# Patient Record
Sex: Female | Born: 1975 | Race: Black or African American | Hispanic: No | Marital: Married | Smoking: Never smoker
Health system: Southern US, Community
[De-identification: ages and names within clinical notes are randomized; demographics above are authoritative.]

## PROBLEM LIST (undated history)

## (undated) DIAGNOSIS — E119 Type 2 diabetes mellitus without complications: Secondary | ICD-10-CM

## (undated) DIAGNOSIS — E785 Hyperlipidemia, unspecified: Secondary | ICD-10-CM

## (undated) DIAGNOSIS — C19 Malignant neoplasm of rectosigmoid junction: Secondary | ICD-10-CM

## (undated) HISTORY — PX: COLON SURGERY: SHX602

---

## 2006-10-25 ENCOUNTER — Encounter: Payer: Self-pay | Admitting: Physician Assistant

## 2006-10-25 ENCOUNTER — Ambulatory Visit: Payer: Self-pay | Admitting: Obstetrics & Gynecology

## 2006-10-26 ENCOUNTER — Ambulatory Visit: Payer: Self-pay | Admitting: Family Medicine

## 2006-10-27 ENCOUNTER — Ambulatory Visit: Payer: Self-pay | Admitting: Obstetrics and Gynecology

## 2006-10-31 ENCOUNTER — Ambulatory Visit (HOSPITAL_COMMUNITY): Admission: RE | Admit: 2006-10-31 | Discharge: 2006-10-31 | Payer: Self-pay | Admitting: Obstetrics & Gynecology

## 2006-11-08 ENCOUNTER — Ambulatory Visit: Payer: Self-pay | Admitting: Obstetrics & Gynecology

## 2006-11-13 ENCOUNTER — Ambulatory Visit: Payer: Self-pay | Admitting: Family Medicine

## 2006-11-13 ENCOUNTER — Encounter: Admission: RE | Admit: 2006-11-13 | Discharge: 2006-11-13 | Payer: Self-pay | Admitting: Obstetrics & Gynecology

## 2006-11-20 ENCOUNTER — Ambulatory Visit: Payer: Self-pay | Admitting: Obstetrics & Gynecology

## 2006-11-20 ENCOUNTER — Ambulatory Visit (HOSPITAL_COMMUNITY): Admission: RE | Admit: 2006-11-20 | Discharge: 2006-11-20 | Payer: Self-pay | Admitting: Obstetrics & Gynecology

## 2006-11-21 ENCOUNTER — Ambulatory Visit: Payer: Self-pay | Admitting: *Deleted

## 2006-11-21 ENCOUNTER — Inpatient Hospital Stay (HOSPITAL_COMMUNITY): Admission: AD | Admit: 2006-11-21 | Discharge: 2006-11-21 | Payer: Self-pay | Admitting: Family Medicine

## 2006-11-27 ENCOUNTER — Ambulatory Visit: Payer: Self-pay | Admitting: Obstetrics & Gynecology

## 2006-11-30 ENCOUNTER — Ambulatory Visit: Payer: Self-pay | Admitting: Obstetrics & Gynecology

## 2006-12-07 ENCOUNTER — Ambulatory Visit: Payer: Self-pay | Admitting: Obstetrics and Gynecology

## 2006-12-10 ENCOUNTER — Inpatient Hospital Stay (HOSPITAL_COMMUNITY): Admission: AD | Admit: 2006-12-10 | Discharge: 2006-12-14 | Payer: Self-pay | Admitting: Obstetrics & Gynecology

## 2006-12-10 ENCOUNTER — Ambulatory Visit: Payer: Self-pay | Admitting: Obstetrics & Gynecology

## 2006-12-11 ENCOUNTER — Encounter: Payer: Self-pay | Admitting: Obstetrics & Gynecology

## 2008-05-06 IMAGING — US US OB COMP +14 WK
1 series · 14 of 28 positions shown · non-contrast
Comparison: none

OBSTETRICAL ULTRASOUND:
 This ultrasound was performed in The [HOSPITAL], and the [facility] report will be stored to [REDACTED] PACS.

[Series 1: us ob comp +14 wk · 14 of 61 slices shown]
[im 3/61]
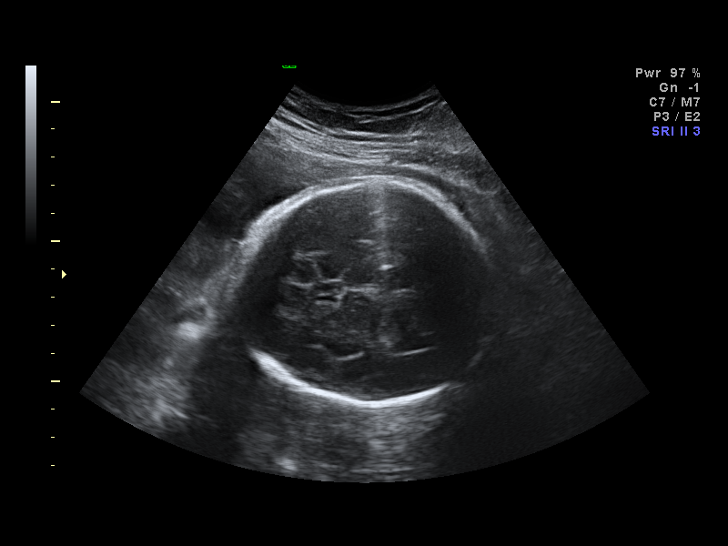
[im 7/61]
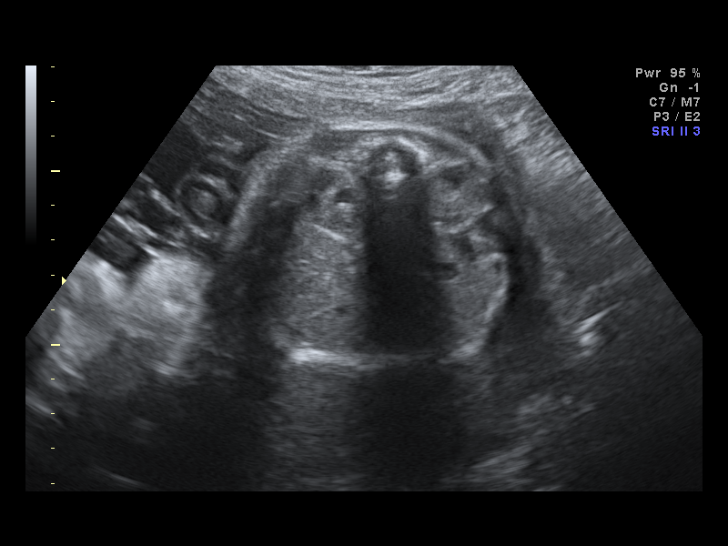
[im 12/61]
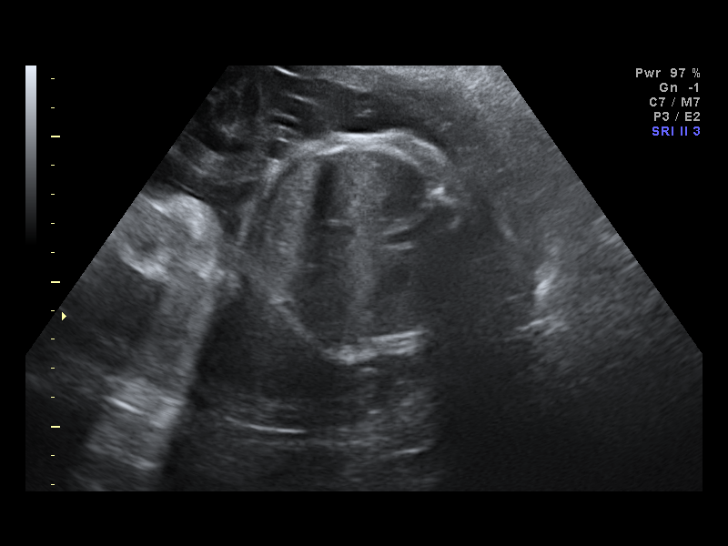
[im 16/61]
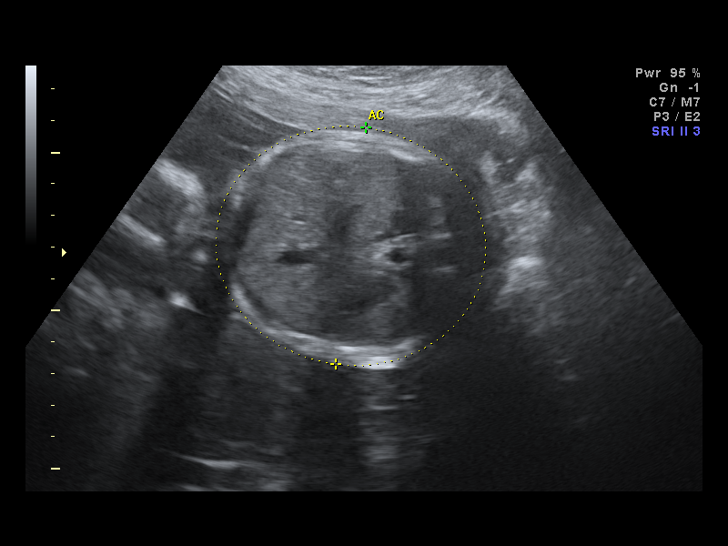
[im 21/61]
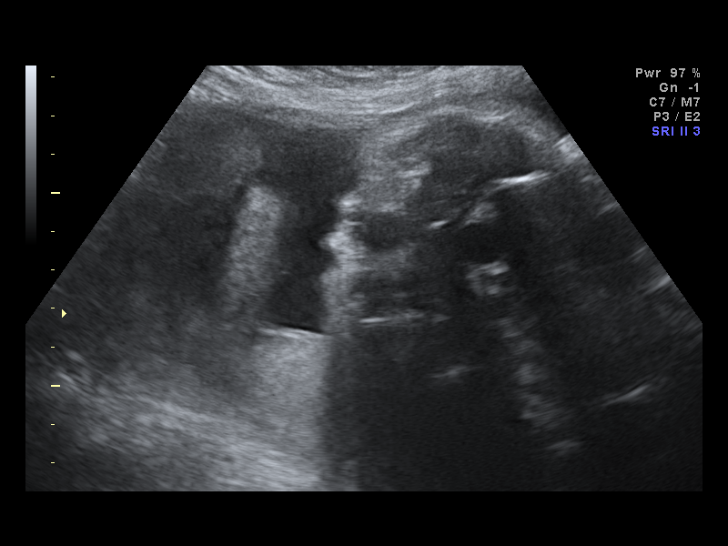
[im 25/61]
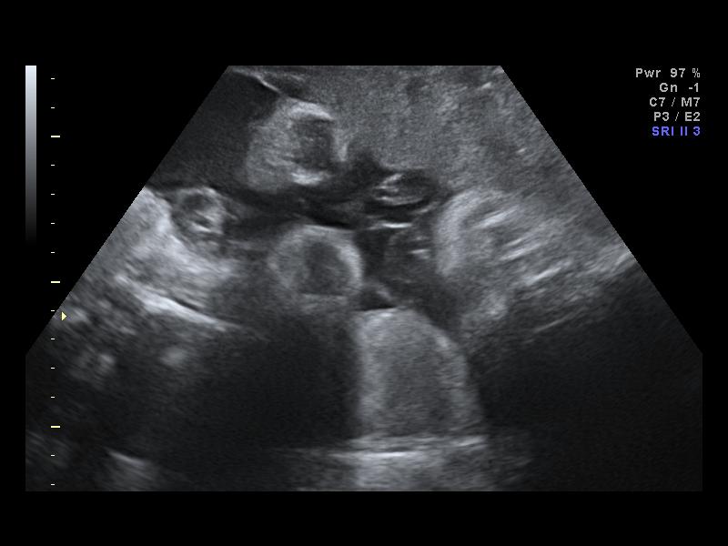
[im 29/61]
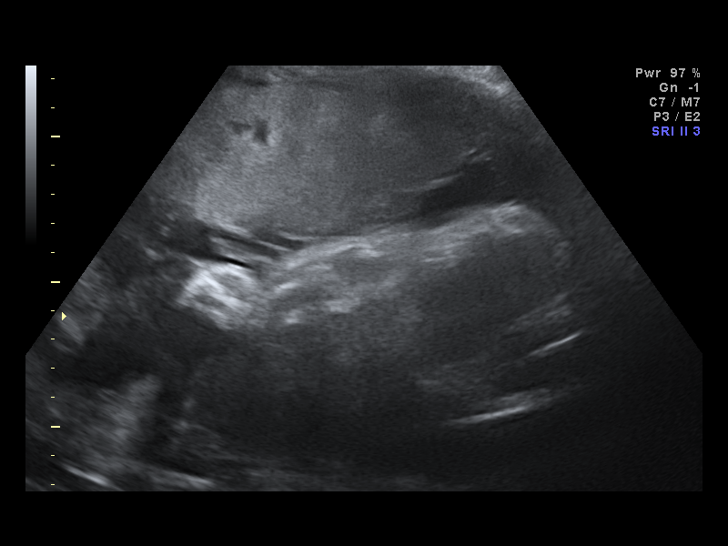
[im 34/61]
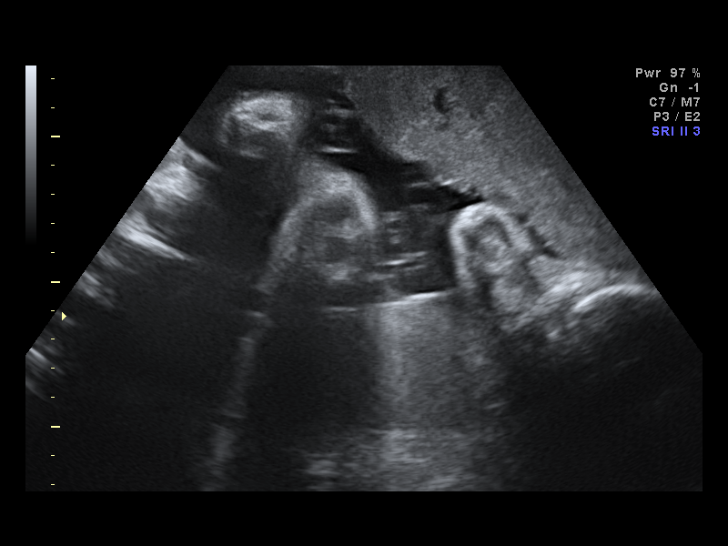
[im 38/61]
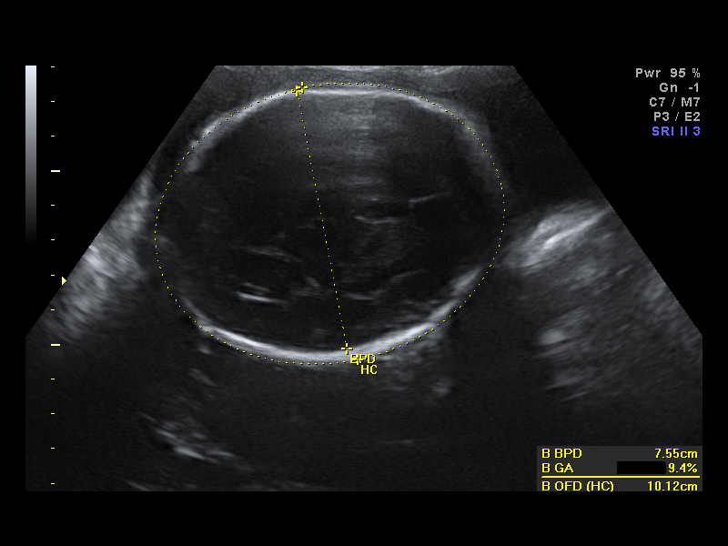
[im 43/61]
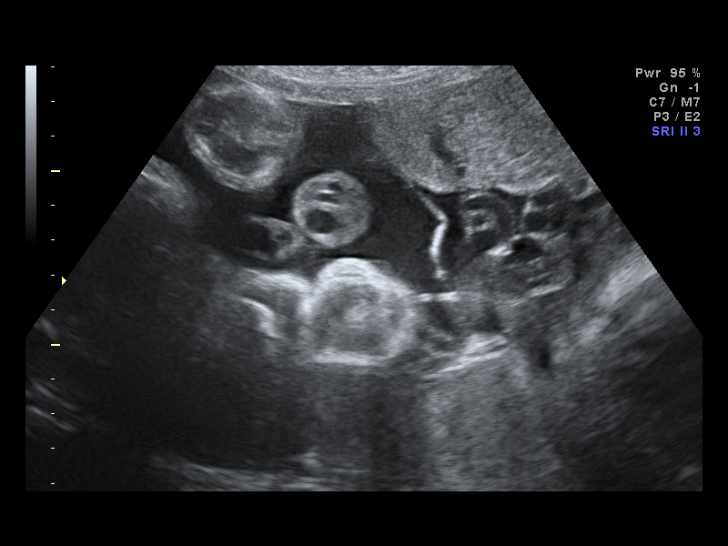
[im 47/61]
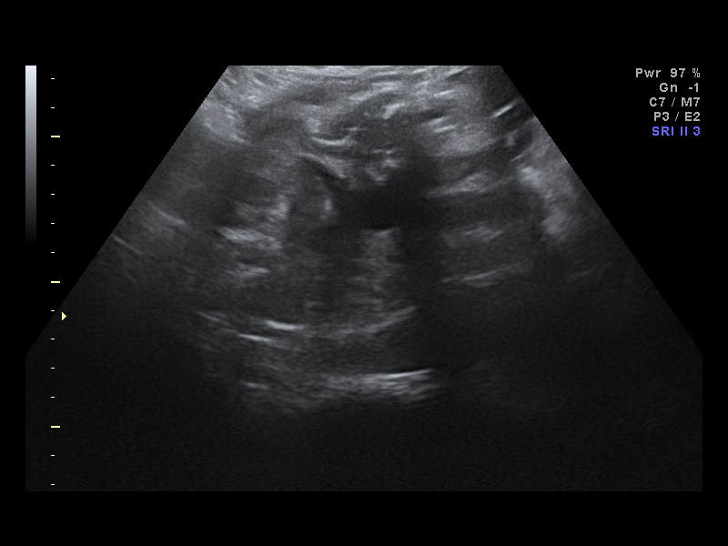
[im 52/61]
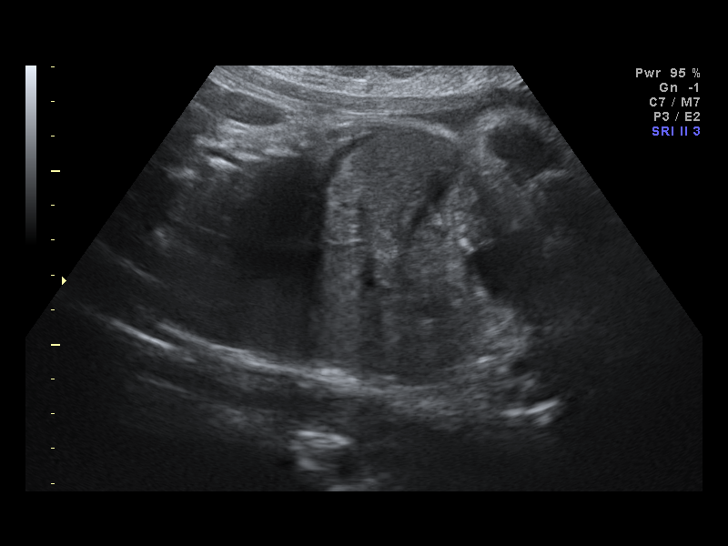
[im 56/61]
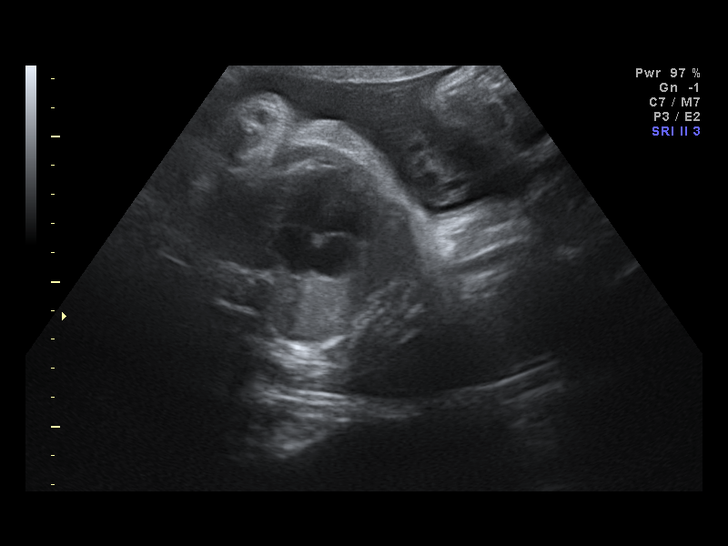
[im 61/61]
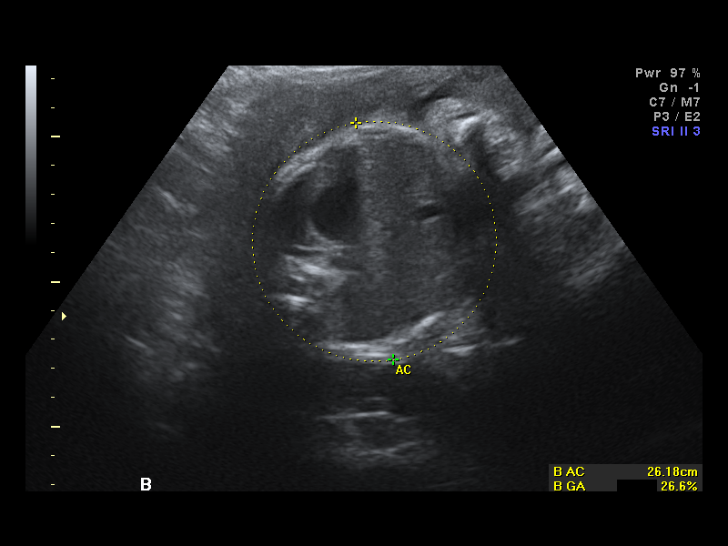

[14 of 28 positions shown; findings below may reference images not displayed]

IMPRESSION: The [facility] report has also been faxed to the ordering physician.

## 2008-05-26 IMAGING — US US OB FOLLOW-UP
1 series · 14 of 28 positions shown · non-contrast
Comparison: none

OBSTETRICAL ULTRASOUND:
 This ultrasound was performed in The [HOSPITAL], and the [facility] report will be stored to [REDACTED] PACS.

[Series 1: us ob follow-up · 14 of 51 slices shown]
[im 2/51]
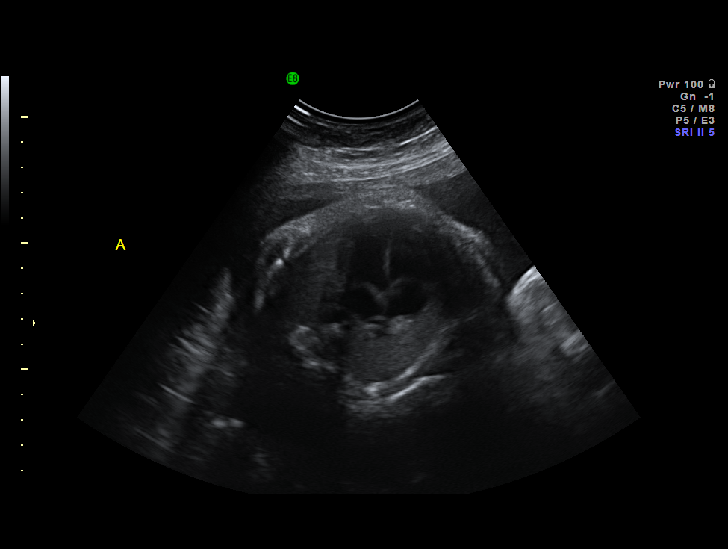
[im 6/51]
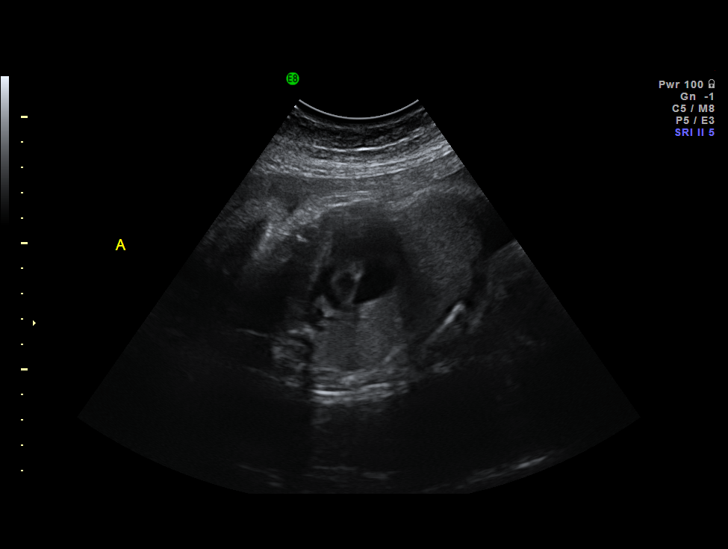
[im 10/51]
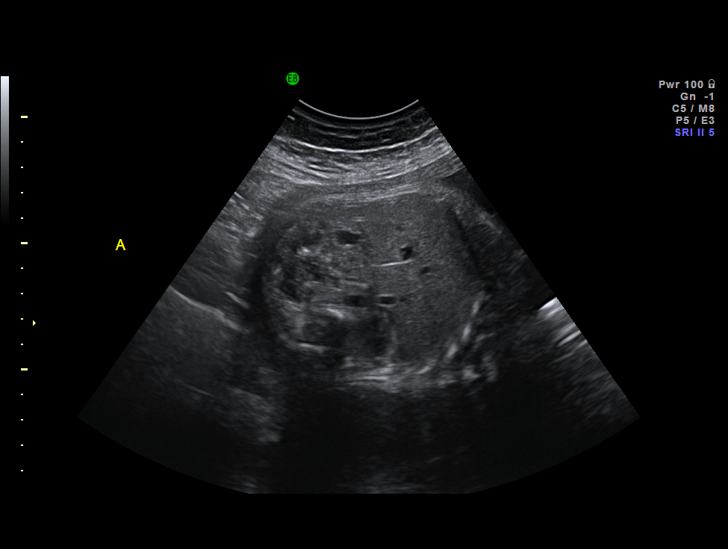
[im 13/51]
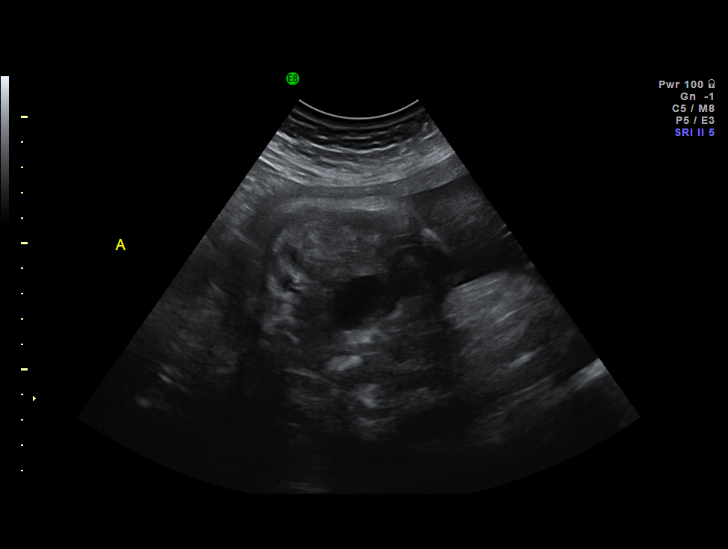
[im 17/51]
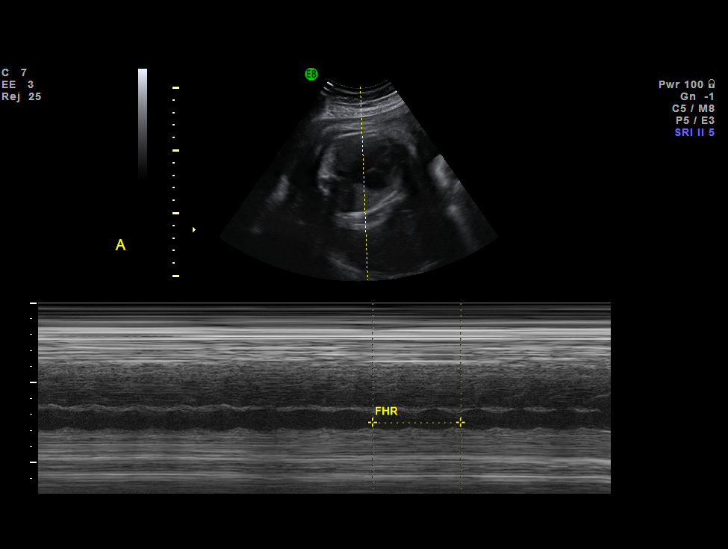
[im 21/51]
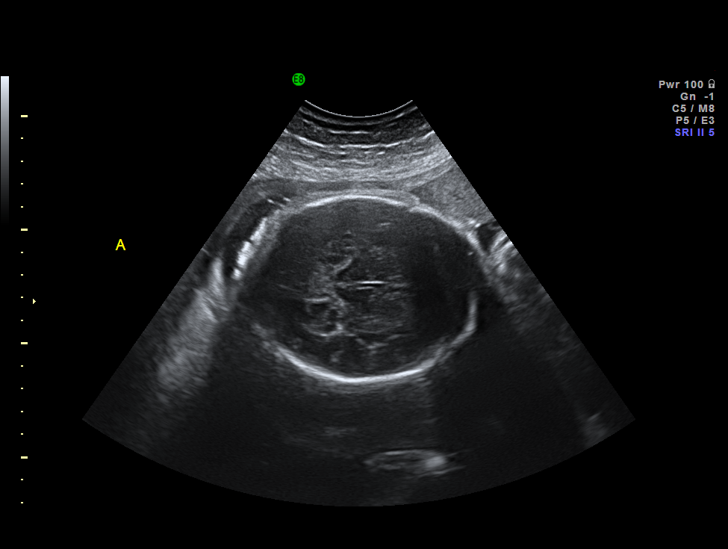
[im 25/51]
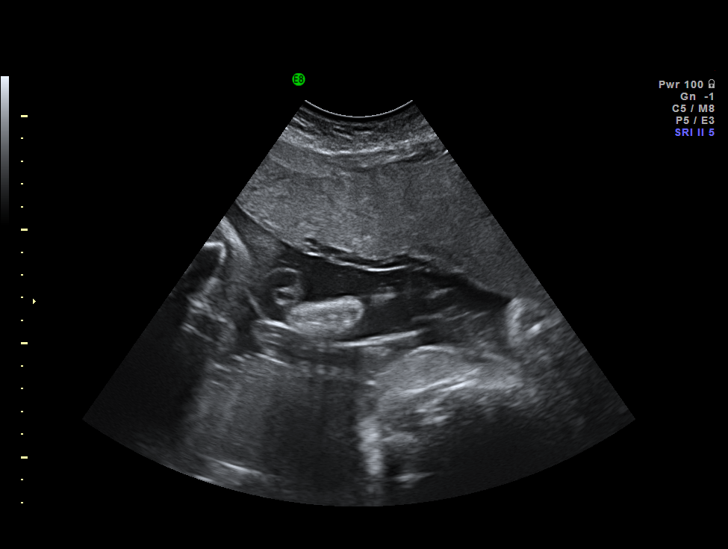
[im 28/51]
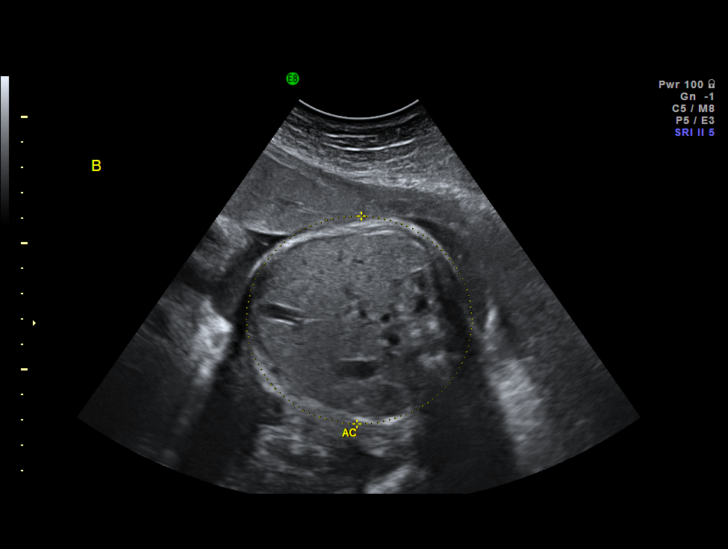
[im 32/51]
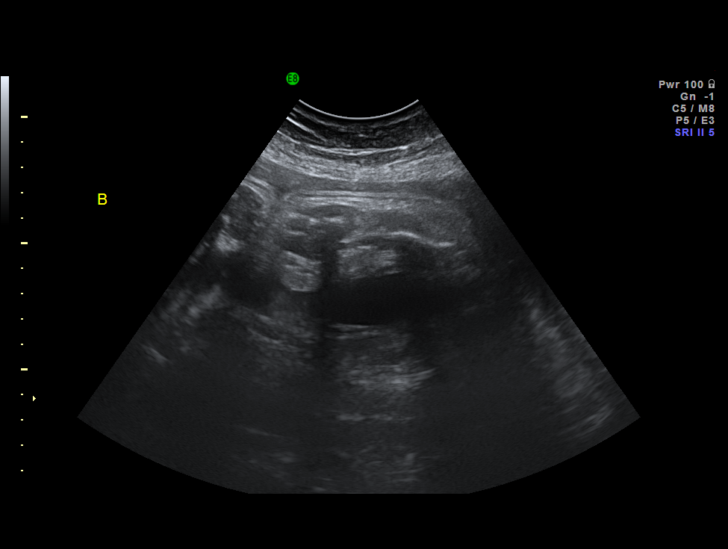
[im 36/51]
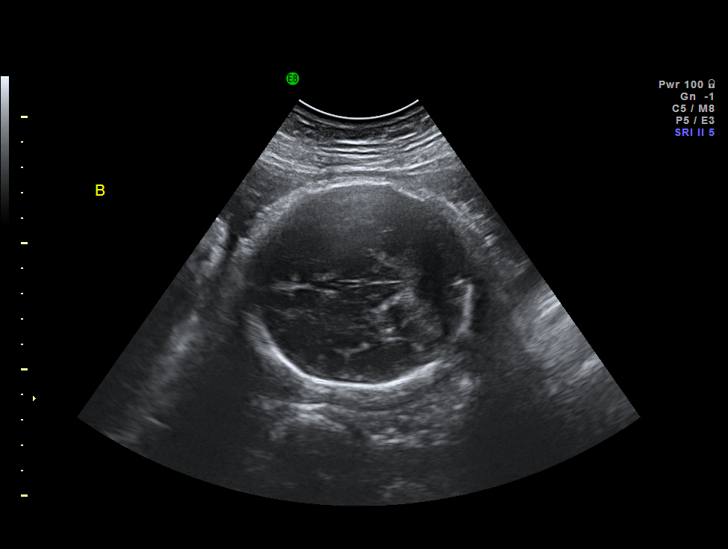
[im 39/51]
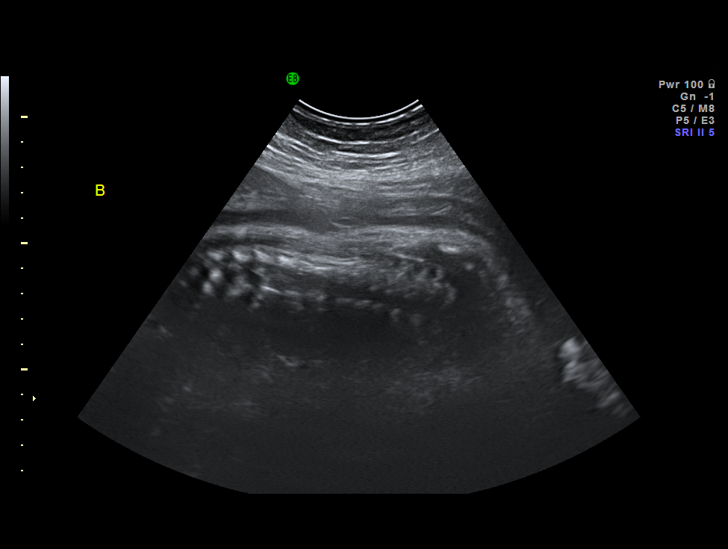
[im 43/51]
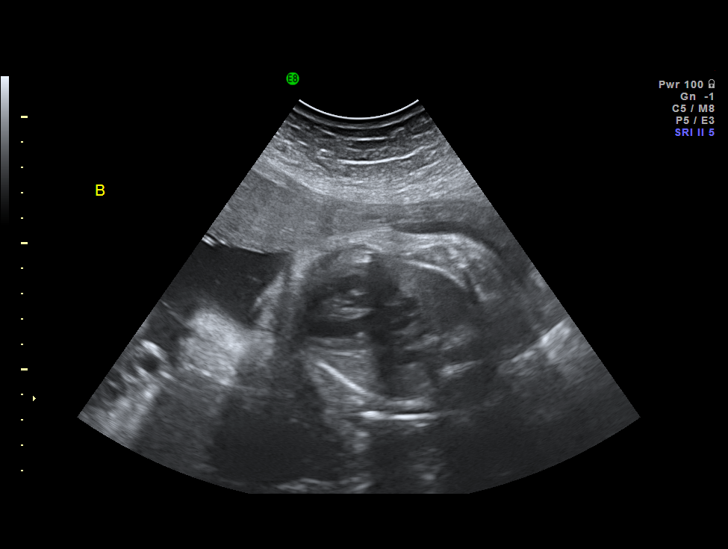
[im 47/51]
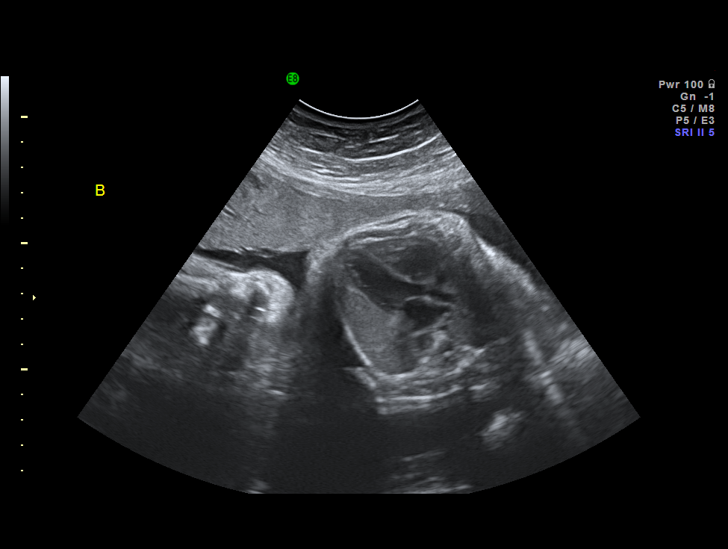
[im 51/51]
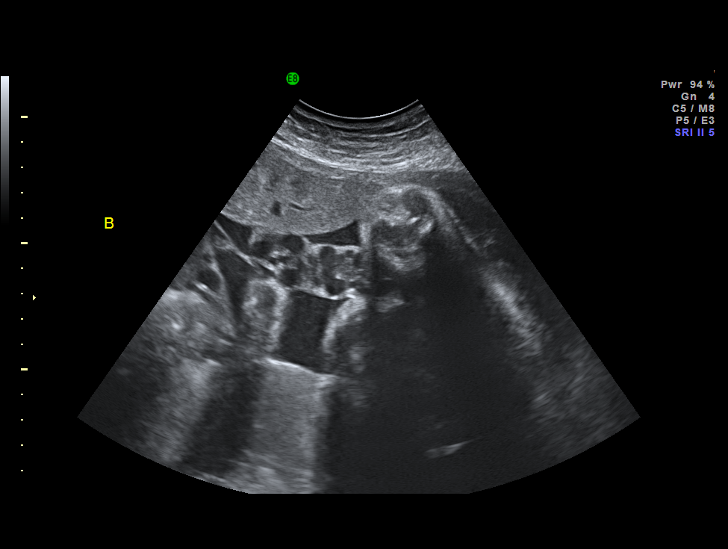

[14 of 28 positions shown; findings below may reference images not displayed]

IMPRESSION: The [facility] report has also been faxed to the ordering physician.

## 2010-02-21 ENCOUNTER — Encounter: Payer: Self-pay | Admitting: *Deleted

## 2010-06-15 NOTE — Op Note (Signed)
[name], [name]             ACCOUNT NO.:  [id number]   MEDICAL RECORD NO.:  [id number]          PATIENT TYPE:  INP   LOCATION:  9125                          FACILITY:  [facility]   PHYSICIAN:  Allie Bossier, MD        DATE OF BIRTH:  Jan 10, 1976   DATE OF PROCEDURE:  DATE OF DISCHARGE:                               OPERATIVE REPORT   PREOPERATIVE DIAGNOSES:  1. Twin pregnancy at 36-1/2 weeks' estimated gestational age with      labor.  2. Morbid obesity.  3. Gestational diabetes.  4. Fetal bradycardia of baby B with a transverse position.  5. Vaginal laceration after spontaneous vaginal delivery of baby A.   POSTOPERATIVE DIAGNOSES:  1. Twin pregnancy at 36-1/2 weeks' estimated gestational age with      labor.  2. Morbid obesity.  3. Gestational diabetes.  4. Fetal bradycardia of baby B with a transverse position.  5. Vaginal laceration after spontaneous vaginal delivery of baby A.   PROCEDURES:  1. Normal spontaneous vaginal delivery of baby A.  2. Primary low transverse stat cesarean section of baby B.  3. Repair of vaginal laceration.   SURGEON:  Myra C. Marice Potter, MD   ASSISTANT:  Karlton Lemon, MD   ANESTHESIA:  Epidural, Raul Del, MD   COMPLICATIONS:  None.   ESTIMATED BLOOD LOSS:  Less than 500 mL.   SPECIMENS:  Placenta, cord blood and cord pH of baby B.   DETAILED PROCEDURE AND FINDINGS:  When the patient reached complete  cervical dilation she was taken to the operating room and placed in the  dorsal lithotomy position with a slight left lateral tilt.  She  delivered baby A spontaneously.  During the process of the delivery her  female circumcision was interrupted with a laceration of the apposed  labia.  At this point after delivery of baby A, I did an internal exam.  The intact membranes of baby B were noted.  There was no presenting part  felt.  I immediately did a bedside ultrasound, which showed baby B to be  in a transverse back-up position.  With  the assistance of Dr. Darrol Angel, we  attempted an external cephalic version; however, the baby did not move  in that direction and instead converted to a double footling breech.  With the membranes still intact, I had a grip on both of the feet of  baby B.  The nurse was unable to find the baby B's heart rate with an  external tocometer and an ultrasound at the bedside was again done.  The  heart of baby B showed no activity.  This was confirmed by the NICU  doctor and the patient was quickly converted for a stat cesarean  section.  After Betadine was poured rapidly on her abdomen, a drape was  placed.  Adequate anesthesia was assured and a transverse incision was  made on her skin and down through the very large amount of subcutaneous  tissue to the fascia.  Fascia was scored in the midline, fascial  incision was extended bilaterally.  The midportion of  the rectus was  opened with the Bovie and the muscles were stretched to the side.  The  peritoneum was entered bluntly.  Bladder blade was placed.  A transverse  incision was made on the very well-developed, almost ballooning out,  lower uterine segment.  The placenta was anterior.  This was displaced  and the membranes were broken on baby B.  The baby was delivered from  double footling position.  After delivery of the anterior shoulder, the  baby was rotated and the head was kept flexed during delivery of the  head.  Its mouth and nostrils were rapidly suctioned, cord was clamped  and cut, and the baby was transferred to the NICU personnel for further  care.  Its initial Apgar was 1 and its Apgars at 5 minutes was 8.  Baby  A weighed 5 pounds 8 ounces and had Apgars of 9 and 9.  Baby B weighed 5  pounds 6 ounces with Apgars of 1 and 8.  A segment of cord from baby B  was removed to obtain a pH.  The placenta was then extracted manually  and quickly sent to pathology.  The uterine interior was cleaned with a  dry lap sponge.  The uterus was  left in situ.  A a bladder blade was  placed and the uterine incision was closed with two layers of 0 chromic  running locking suture, the second layer imbricating the first.  Excellent hemostasis was noted.  The rectus muscles and rectus fascia  were inspected and noted be hemostatic.  The uterine incision was again  inspected and noted to be hemostatic.  The fascia was closed with 0  Vicryl running, nonlocking suture.  No defects were palpable.  The  subcutaneous tissue was irrigated and cleaned and dried.  It was then  infiltrated with 20 mL of 0.5% Marcaine.  A subcuticular closure was  done with 3-0 Vicryl.  Steri-Strips were placed across the incision.  At  this point the patient was placed in lithotomy position.  Please note  that her Foley catheter drained clear urine throughout.  She was placed  in dorsal lithotomy position and her vulva was inspected.  The  previously-anastomosed labia were lacerated in the midline.  She was  adamant that she wished to have her personal normal anatomy restored (to  a female circumcision).  I agreed to do this and did this in a running  nonlocking fashion with a 3-0 Vicryl suture.  Excellent hemostasis was  noted.  She was taken to the recovery room in stable condition with  instrument, sponge and needle counts correct.  The patient tolerated the  procedure well.      Allie Bossier, MD  Electronically Signed     MCD/MEDQ  D:  12/11/2006  T:  12/12/2006  Job:  [id number]

## 2010-06-15 NOTE — Discharge Summary (Signed)
[name], [name]             ACCOUNT NO.:  [id number]   MEDICAL RECORD NO.:  [id number]          PATIENT TYPE:  INP   LOCATION:  9125                          [facility]   PHYSICIAN:  Allie Bossier, MD        DATE OF BIRTH:  [birth year]   DATE OF ADMISSION:  12/10/2006  DATE OF DISCHARGE:  12/14/2006                               DISCHARGE SUMMARY   REASON FOR ADMISSION:  Twin pregnancy at 37 and five days gestation with  spontaneous onset of labor.   DISCHARGE DIAGNOSIS:  Postoperative day #3 status post delivery of twin  gestation. First twin by spontaneous vaginal delivery, second twin by  cesarean section.   SIGNIFICANT FINDINGS:  Preoperative CBC performed on the day of  admission showed a hemoglobin of 11.8, white blood cell count 9.2.  Platelet count 315. On prenatal lab work, the patient was O positive.  Antibody screen was negative.  Rubella immune. Hepatitis B surface  antigen negative. RPR negative. Gonorrhea negative.  Chlamydia negative.  Hemoglobin electrophoresis negative. Pap smear was within normal limits.  An HIV was not reactive.  GBS obtained was also negative on prenatal  workup.  On postoperative day #1,  the patient had a hemoglobin of 8.2,  white blood cell count 12.5 and a platelet count of 284.   HOSPITAL COURSE:  The patient was admitted at 36 weeks and give days  gestation with a twin pregnancy with spontaneous onset of labor. The  patient is a 35 year old female. The patient's pregnancy was also  complicated by morbid obesity and gestational diabetes.  The patient  underwent a normal spontaneous vaginal delivery of baby A. After  delivery of baby A, baby B was noted to be in the transverse back-up  position. External cephalic version was attempted and instead the baby  was converted to a double footling breech. The baby B's heart rate was  attempted to be traced and heart rate was not found, therefore, a stat  cesarean section was performed.   Betadine was poured rapidly over the  patient's abdomen, a drape was placed. Adequate anesthesia was given and  a transverse incision was made on the skin down and a very large amount  of subcutaneous fascia noted secondary to the patient's obesity.  The  baby was delivered from the double footling position. Mouth and nostrils  were rapidly suctioned. The cord was clamped and cut and the baby was  transferred to the NICU. Initial Apgar of baby B was 1 at one minute and  8 at five minutes.  Baby A weighed 5 pounds and 8 ounces with Apgars of  8 and 9. Baby B weighed 5 pounds and 6 ounces, again with Apgars of 1  and 8.  Cord blood pH was obtained on baby B. The patient tolerated this  procedure well, was taken to the recovery room in stable condition.  Postoperatively, the patient did well. There was noted decreased urinary  output. The patient was bolused with normal saline and had good response  of her urine output with a total fluid balance on postoperative  day #1  negative 1.2 liters.  The patient continued to make good urine  throughout the rest of her hospital stay. Also the patient remained  afebrile throughout her postoperative course and vital signs remained  stable as well.  On postoperative day #3, the patient was deemed fit for  discharge. She was tolerating food by mouth, had had positive urine and  positive  flatus, but had not yet had a bowel movement. She was  ambulating well without difficulty and as there were no staples to be  removed, the patient did not have to have staple removal prior to her  discharge. On postoperative day #3, the patient was discharged to home  in stable and in good condition.   DISCHARGE MEDICATIONS:  1. Vicodin one every three to four hours as needed.  2. Ibuprofen 600 mg p.o. every six hours p.r.n. pain.  3. Colace 100 mg p.o. b.i.d. p.r.n. constipation.  4. Prenatal vitamins for the next six weeks or while breast feeding.   DISCHARGE  INSTRUCTIONS:  1. The patient is to take medications administered.  2. The patient is to maintain pelvic rest for six weeks with nothing      in the vagina at this time.  3. The patient is not undertake any heavy lifting for the next six      weeks.  4. The patient is to follow up with [facility]      in six weeks.  5. The patient is to return for any increased redness or pain at her      incision site, any evidence of wound dehiscence, lightheadedness,      presyncopal symptoms, increased abdominal pain, increased bleeding      or for any other concerning symptoms.  6. The patient declined any form of birth control at discharge, the      patient was breast feeding both babies at discharge.   DISCHARGE CONDITION:  .  Is stable, good.      Michele Soman, MD      Allie Bossier, MD  Electronically Signed    TE/MEDQ  D:  12/14/2006  T:  12/15/2006  Job:  [id number]

## 2010-11-09 LAB — CBC
HCT: 23.7 — ABNORMAL LOW
HCT: 34.4 — ABNORMAL LOW
Hemoglobin: 11.8 — ABNORMAL LOW
Hemoglobin: 8.2 — ABNORMAL LOW
MCHC: 34.2
MCHC: 34.7
MCV: 81.8
RBC: 2.89 — ABNORMAL LOW
WBC: 9.2

## 2010-11-09 LAB — RPR: RPR Ser Ql: NONREACTIVE

## 2010-11-10 LAB — POCT URINALYSIS DIP (DEVICE)
Ketones, ur: NEGATIVE
Nitrite: NEGATIVE
Operator id: 297281
Protein, ur: >=300 — AB
Protein, ur: >=300 — AB
Specific Gravity, Urine: 1.025
Specific Gravity, Urine: >=1.03
Urobilinogen, UA: 0.2

## 2010-11-10 LAB — CBC
Hemoglobin: 11.4 — ABNORMAL LOW
RBC: 4.09
RDW: 15.7 — ABNORMAL HIGH

## 2010-11-10 LAB — LACTATE DEHYDROGENASE: LDH: 124

## 2010-11-10 LAB — COMPREHENSIVE METABOLIC PANEL
Albumin: 2.2 — ABNORMAL LOW
CO2: 21
Calcium: 9.3
Chloride: 105
Creatinine, Ser: 0.57
Glucose, Bld: 119 — ABNORMAL HIGH
Potassium: 3.5
Total Protein: 6.5

## 2010-11-10 LAB — URIC ACID: Uric Acid, Serum: 4.3

## 2010-11-11 LAB — POCT URINALYSIS DIP (DEVICE)
Glucose, UA: NEGATIVE
Glucose, UA: NEGATIVE
Nitrite: NEGATIVE
Nitrite: NEGATIVE
Operator id: 14902
Operator id: 148111
Protein, ur: >=300 — AB
Specific Gravity, Urine: >=1.03
Specific Gravity, Urine: >=1.03

## 2022-02-18 NOTE — Progress Notes (Signed)
I spoke with [name] the patient's daughter.  The patient had biopsy and colon resection in [place].  The daughter will call and request the pathology reports from surgery and colonoscopy be faxed to me at [phone].

## 2022-02-21 ENCOUNTER — Other Ambulatory Visit: Payer: Self-pay

## 2022-02-21 ENCOUNTER — Ambulatory Visit
Admission: RE | Admit: 2022-02-21 | Discharge: 2022-02-21 | Disposition: A | Discharge status: Home / Self Care | Payer: Self-pay | Source: Ambulatory Visit | Attending: Hematology | Admitting: Hematology

## 2022-02-21 DIAGNOSIS — C187 Malignant neoplasm of sigmoid colon: Secondary | ICD-10-CM

## 2022-02-21 NOTE — Progress Notes (Signed)
We received disc with imaging from [facility]. We also received stain slides with tissue sample.  I will deliver the disc to radiology and the slide to pathology.

## 2022-02-24 ENCOUNTER — Telehealth (HOSPITAL_COMMUNITY): Payer: Self-pay

## 2022-02-24 NOTE — Progress Notes (Unsigned)
[facility] Telephone:([phone]) [phone]   Fax:(336) [phone]  CONSULT NOTE  REASON FOR CONSULTATION:  Colorectal cancer   HPI [name] is a 47 y.o. female with no significant past medical history except for diabetes, congential left renal atrophy, and hypertension was referred to the clinic for newly diagnosed colorectal cancer.   The patients initial workup took place in [place]. She had rectal bleeding, constipation, lower abdominal pain, and weight loss for approximately 6 months. Therefore, she had a coloscopy performed at [facility] clinic on 12/08/21 showing near obstructing sigmoid mass, 18 cm from the anal verge. The colonoscope could not pass through the lesion.   The final pathology was consistent with moderately differentiated adenocarcinoma of the colon.   The patient had staging CT of the CAP on 12/14/21 revealing known carcinoma of the sigmoid colon, regional small lymph nodes and no other findings of distant metastatic disease.   She underwent surgical resection on 01/05/22 that showed 5.5 x 5 x 1 cm mass, grade 2, adenocarcinoma. 1 lymph node of 23 lymph nodes examined was positive for carcinoma. This was staged at T3, N1a, M0.   She saw Dr. Billey Gosling in [place] who recommended adjuvant CAPOX for 6 months.  She did not start this as she would like to get her care here in [place] to be closer to her daughters.   Overall, the patient is feeling fine today. She has no complaints.  She recovered well from her surgery.  She denies any fever, chills, or night sweats. She denies appetite change.  She denies any abdominal pain.  The surgical scars are healing nicely.  Denies any nausea, vomiting, diarrhea, or constipation.  Denies any blood in the stool.  Denies any jaundice or itching.  Denies any shortness of breath, cough, or hemoptysis.  She became tearful when discussing side effects of treatment.   The patient denies any family history of cancer.  She reports  that her family has diabetes and heart disease.  The patient is married and her husband is still living in [place].  She denies any smoking, drug, or alcohol use.  She is independent with her activities of daily living.  She is currently living in an apartment with her 3 children.   HPI  No past medical history on file.  *** The histories are not reviewed yet. Please review them in the "History" navigator section and refresh this [place].  No family history on file.  Social History    Not on File  Current Outpatient Medications  Medication Sig Dispense Refill   ondansetron (ZOFRAN) 8 MG tablet Take 1 tablet (8 mg total) by mouth every 8 (eight) hours as needed for nausea or vomiting. Starting 3 days after chemotherapy 30 tablet 2   prochlorperazine (COMPAZINE) 10 MG tablet Take 1 tablet (10 mg total) by mouth every 6 (six) hours as needed. 30 tablet 2   No current facility-administered medications for this visit.    REVIEW OF SYSTEMS:   Review of Systems  Constitutional: Negative for appetite change, chills, fatigue, fever and unexpected weight change.  HENT: Negative for mouth sores, nosebleeds, sore throat and trouble swallowing.   Eyes: Negative for eye problems and icterus.  Respiratory: Negative for cough, hemoptysis, shortness of breath and wheezing.   Cardiovascular: Negative for chest pain and leg swelling.  Gastrointestinal: Negative for abdominal pain, constipation, diarrhea, nausea and vomiting.  Genitourinary: Negative for bladder incontinence, difficulty urinating, dysuria, frequency and hematuria.   Musculoskeletal: Negative  for back pain, gait problem, neck pain and neck stiffness.  Skin: Negative for itching and rash.  Neurological: Negative for dizziness, extremity weakness, gait problem, headaches, light-headedness and seizures.  Hematological: Negative for adenopathy. Does not bruise/bleed easily.  Psychiatric/Behavioral: Negative for confusion, depression  and sleep disturbance. The patient is not nervous/anxious.     PHYSICAL EXAMINATION:  Blood pressure 126/79, pulse 75, resp. rate 20, height '5\' 5"'$  (1.651 m), weight 278 lb 12.8 oz (126.5 kg), SpO2 100 %.  ECOG PERFORMANCE STATUS: 1  Physical Exam  Constitutional: Oriented to person, place, and time and well-developed, well-nourished, and in no distress.  HENT:  Head: Normocephalic and atraumatic.  Mouth/Throat: Oropharynx is clear and moist. No oropharyngeal exudate.  Eyes: Conjunctivae are normal. Right eye exhibits no discharge. Left eye exhibits no discharge. No scleral icterus.  Neck: Normal range of motion. Neck supple.  Cardiovascular: Normal rate, regular rhythm, normal heart sounds and intact distal pulses.   Pulmonary/Chest: Effort normal and breath sounds normal. No respiratory distress. No wheezes. No rales.  Abdominal: Soft. Bowel sounds are normal. Exhibits no distension and no mass. There is no tenderness.  Musculoskeletal: Normal range of motion. Exhibits no edema.  Lymphadenopathy:    No cervical adenopathy.  Neurological: Alert and oriented to person, place, and time. Exhibits normal muscle tone. Gait normal. Coordination normal.  Skin: Skin is warm and dry. No rash noted. Not diaphoretic. No erythema. No pallor.  Psychiatric: Mood, memory and judgment normal.  Vitals reviewed.  LABORATORY DATA: Lab Results  Component Value Date   WBC 8.4 02/25/2022   HGB 11.5 (L) 02/25/2022   HCT 36.7 02/25/2022   MCV 74.9 (L) 02/25/2022   PLT 728 (H) 02/25/2022      Chemistry      Component Value Date/Time   NA 138 02/25/2022 1451   K 4.1 02/25/2022 1451   CL 107 02/25/2022 1451   CO2 22 02/25/2022 1451   BUN 30 (H) 02/25/2022 1451   CREATININE 1.27 (H) 02/25/2022 1451      Component Value Date/Time   CALCIUM 9.7 02/25/2022 1451   ALKPHOS 79 02/25/2022 1451   AST 12 (L) 02/25/2022 1451   ALT 10 02/25/2022 1451   BILITOT 0.1 (L) 02/25/2022 1451        RADIOGRAPHIC STUDIES: No results found.  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 MMR normal. Diagnosed in November 2023.  -Underwent evaluation in [place] for rectal bleeding which showed near obstructing sigmoid colon mass.  -CT CAP showed known carcinoma of the sigmoid colon, regional small lymph nodes and no other findings of distant metastatic disease.  -She underwent surgical resection on 01/05/22 that showed 5.5 x 5 x 1 cm mass, grade 2, adenocarcinoma. 1 lymph node of 23 lymph nodes examined was positive for carcinoma. This was staged at T3, N1a, M0. MMR normal  -The patient was seen with Dr. Burr Medico today. -We discussed stage III disease and the recurrence risk even after complete surgical resection.  We discussed the rationale for adjuvant chemotherapy consisting of 3 months intravenous chemo and oral chemothearpy.  Given the patient's young age and performance status, Dr. Burr Medico would recommend CAPOX. -The patient was initially concerned and tearful when discussing treatment. She is also not interested in a Port-A-Cath.  After lengthy discussion the patient is agreeable to proceed with treatment.  Dr. Burr Medico will reduce the dose of her oxaliplatin  from cycle #1 to ensure good tolerance and may consider increasing the dose for future cycles if she tolerates well.  If patient has poor tolerance to oxaliplatin, discussed we can always discontinue it if needed, but Dr. Burr Medico encouraged her to try it as that is the most effective option to reduce the risk of recurrence. Dr. Ernestina Penna recommendation would be to try combination Xeloda and capecitabine.  -Discussed CapOx with oxaliplatin day 1 q. 21 days and Xeloda twice daily on days 1 through 14 q. 21 days.  -Chemotherapy consent: Side effects including but not limited to fatigue, nausea, vomiting, diarrhea and/or constipation, cold sensitivity,  neuropathy, hand-foot syndrome, fluid retention, kidney and liver dysfunction, immunosuppression/neutropenic fever, need for blood transfusion, and bleeding, were discussed with patient in great detail. She agrees to proceed.  The goal of neoadjuvant chemo is curative.  -She prefers to try chemo without a port.  -Will arrange for baseline CBC, CMP, CEA, ferritin, and iron studies today. -Will arrange for chemo education class prior to receiving her first cycle of treatment. -Follow-up in 1 weeks with cycle 1 day 1, then toxicity check 1 week later -Patient seen with Dr. Burr Medico  Iron Deficiency Anemia -The patient's labs from today demonstrate iron deficiency and mild anemia with a hemoglobin 11.5 -Recommend that the patient take the ferrous sulfate 325 mg tablet p.o. daily. Advised to take this with vitamin C -She does not need any IV iron at this time but we will consider IV iron in the future if needed  Social -Patient is from [place] and is living with her daughters in an apartment locally. Her husband is in [place]. Her daughter is in college and studying biology -She has Medicaid  -She is independent with her activities of daily living -The patient declined referral to Education officer, museum and nutrition -Reached out to financial aid specialist   Genetics -Given her young age of onset with her cancer diagnosis, she qualifies for genetic counseling. -I reviewed this with the patient and she is interested.  I have placed a referral   PLAN: -Baseline CBC, CMP, iron studies, ferritin, and CEA today -Compazine and zofran sent to pharmacy -Referral to genetic counseling -Arrange chemo education  -Arrange for first cycle of treatment on 03/04/2022 -Will follow-up with first cycle of treatment  -Reached out to pharmacy  -Reached out to financial advocate -Instructed to take iron supplement  The patient voices understanding of current disease status and treatment options and is in agreement with the  current care plan.  All questions were answered. The patient knows to call the clinic with any problems, questions or concerns. We can certainly see the patient much sooner if necessary.  Thank you so much for allowing me to participate in the care of [facility]. I will continue to follow up the patient with you and assist in her care.   Disclaimer: This note was dictated with voice recognition software. Similar sounding words can inadvertently be transcribed and may not be corrected upon review.   Giann Obara L Jodine Muchmore February 25, 2022, 3:35 PM

## 2022-02-25 ENCOUNTER — Inpatient Hospital Stay (HOSPITAL_BASED_OUTPATIENT_CLINIC_OR_DEPARTMENT_OTHER): Payer: Medicaid Other | Admitting: Physician Assistant

## 2022-02-25 ENCOUNTER — Inpatient Hospital Stay: Payer: Medicaid Other | Attending: Physician Assistant

## 2022-02-25 ENCOUNTER — Other Ambulatory Visit (HOSPITAL_COMMUNITY): Payer: Self-pay

## 2022-02-25 ENCOUNTER — Telehealth: Payer: Self-pay

## 2022-02-25 VITALS — BP 126/79 | HR 75 | Resp 20 | Ht 65.0 in | Wt 278.8 lb

## 2022-02-25 DIAGNOSIS — C184 Malignant neoplasm of transverse colon: Secondary | ICD-10-CM

## 2022-02-25 DIAGNOSIS — C19 Malignant neoplasm of rectosigmoid junction: Secondary | ICD-10-CM

## 2022-02-25 DIAGNOSIS — Z7189 Other specified counseling: Secondary | ICD-10-CM | POA: Insufficient documentation

## 2022-02-25 DIAGNOSIS — E119 Type 2 diabetes mellitus without complications: Secondary | ICD-10-CM | POA: Insufficient documentation

## 2022-02-25 DIAGNOSIS — I10 Essential (primary) hypertension: Secondary | ICD-10-CM | POA: Insufficient documentation

## 2022-02-25 DIAGNOSIS — D509 Iron deficiency anemia, unspecified: Secondary | ICD-10-CM

## 2022-02-25 DIAGNOSIS — Z5111 Encounter for antineoplastic chemotherapy: Secondary | ICD-10-CM | POA: Insufficient documentation

## 2022-02-25 DIAGNOSIS — C187 Malignant neoplasm of sigmoid colon: Secondary | ICD-10-CM | POA: Insufficient documentation

## 2022-02-25 LAB — CMP (CANCER CENTER ONLY)
ALT: 10 U/L (ref 0–44)
AST: 12 U/L — ABNORMAL LOW (ref 15–41)
Albumin: 3.8 g/dL (ref 3.5–5.0)
Alkaline Phosphatase: 79 U/L (ref 38–126)
Anion gap: 9 (ref 5–15)
BUN: 30 mg/dL — ABNORMAL HIGH (ref 6–20)
CO2: 22 mmol/L (ref 22–32)
Calcium: 9.7 mg/dL (ref 8.9–10.3)
Chloride: 107 mmol/L (ref 98–111)
Creatinine: 1.27 mg/dL — ABNORMAL HIGH (ref 0.44–1.00)
GFR, Estimated: 52 mL/min — ABNORMAL LOW (ref >60)
Glucose, Bld: 158 mg/dL — ABNORMAL HIGH (ref 70–99)
Potassium: 4.1 mmol/L (ref 3.5–5.1)
Sodium: 138 mmol/L (ref 135–145)
Total Bilirubin: 0.1 mg/dL — ABNORMAL LOW (ref 0.3–1.2)
Total Protein: 8.2 g/dL — ABNORMAL HIGH (ref 6.5–8.1)

## 2022-02-25 LAB — CBC WITH DIFFERENTIAL (CANCER CENTER ONLY)
Abs Immature Granulocytes: 0.04 10*3/uL (ref 0.00–0.07)
Basophils Absolute: 0.1 10*3/uL (ref 0.0–0.1)
Basophils Relative: 1 %
Eosinophils Absolute: 0.1 10*3/uL (ref 0.0–0.5)
Eosinophils Relative: 1 %
HCT: 36.7 % (ref 36.0–46.0)
Hemoglobin: 11.5 g/dL — ABNORMAL LOW (ref 12.0–15.0)
Immature Granulocytes: 1 %
Lymphocytes Relative: 31 %
Lymphs Abs: 2.6 10*3/uL (ref 0.7–4.0)
MCH: 23.5 pg — ABNORMAL LOW (ref 26.0–34.0)
MCHC: 31.3 g/dL (ref 30.0–36.0)
MCV: 74.9 fL — ABNORMAL LOW (ref 80.0–100.0)
Monocytes Absolute: 0.4 10*3/uL (ref 0.1–1.0)
Monocytes Relative: 5 %
Neutro Abs: 5.1 10*3/uL (ref 1.7–7.7)
Neutrophils Relative %: 61 %
Platelet Count: 728 10*3/uL — ABNORMAL HIGH (ref 150–400)
RBC: 4.9 MIL/uL (ref 3.87–5.11)
RDW: 17.2 % — ABNORMAL HIGH (ref 11.5–15.5)
WBC Count: 8.4 10*3/uL (ref 4.0–10.5)
nRBC: 0 % (ref 0.0–0.2)

## 2022-02-25 LAB — IRON AND IRON BINDING CAPACITY (CC-WL,HP ONLY)
Iron: 12 ug/dL — ABNORMAL LOW (ref 28–170)
Saturation Ratios: 3 % — ABNORMAL LOW (ref 10.4–31.8)
TIBC: 396 ug/dL (ref 250–450)
UIBC: 384 ug/dL (ref 148–442)

## 2022-02-25 LAB — FERRITIN: Ferritin: 10 ng/mL — ABNORMAL LOW (ref 11–307)

## 2022-02-25 MED ORDER — ONDANSETRON HCL 8 MG PO TABS: 8.0000 mg | ORAL_TABLET | Freq: Three times a day (TID) | ORAL | 2 refills | Status: DC | PRN

## 2022-02-25 MED ORDER — PROCHLORPERAZINE MALEATE 10 MG PO TABS: 10.0000 mg | ORAL_TABLET | Freq: Four times a day (QID) | ORAL | 2 refills | Status: DC | PRN

## 2022-02-25 NOTE — Progress Notes (Signed)
I met with [name] and her daughter after  her consultation with Cassie Heilingoetter, PA-C and Dr Burr Medico.  I explained my role as a nurse navigator and provided my contact information. I explained the services provided at [facility] and provided written information.  I explained the alight grant and let  her know one of the financial advisors will reach out to  her at the time of her chemo education class.  I briefly reviewed common side effects associated with the recommended chemotherapy regimen,  CAPEOX.  I told her that she will be scheduled for chemotherapy education class prior to receiving chemotherapy. I told her our [facility] our oral chemotherapy pharmacist will call her to review capecitabine.  I told her our schedulers will call her with those appts.  All questions were answered. They verbalized understanding.

## 2022-02-25 NOTE — Telephone Encounter (Signed)
This nurse reached out to patient with the use of [employer] and made patient aware that she does not need IV Iron at this time.  Advised that the provider would like her to start taking over the counter Ferrous Sulfate '352mg'$ .  Advised to take with orange juice one tablet daily.  Patient acknowledged understanding.  No further questions or concerns at this time.

## 2022-02-25 NOTE — Patient Instructions (Addendum)
It was very nice meeting you today. -I know we covered a lot of important information.  Here is a summary of the main take away points. -After surgery, one of the lymph nodes was positive.  The tumor also invaded into the muscle and fat layer of the colon. -Therefore, the risk of recurrence is approximately 30%. -To give you the best chance at reducing the risk of the cancer coming back, Dr. Burr Medico recommends giving you a treatment called CAPOX.  This is a pill that you take every day twice a day for 14 days and then you are off a week and then repeat.  We would recommend starting the pill on the first day of the IV infusion which we will tentatively schedule for next week Friday on 03/04/2022.  -The chemo pharmacist is going to call you with the patient education and let you know when the medication is ready for pickup.  -There is an IV portion of treatment with a medication called oxaliplatin.  This is given 1 time every 3 weeks.  This medication can cause some cold sensitivity with food and drink that is cold.  Therefore we recommend room temperature or warm.  -Dr. Burr Medico would recommend treatment for 3 months as new data showed that 3 months of treatment is just as good as 6 months.  Dr. Burr Medico would recommend starting treatment next week since her surgery was almost 2 months ago.  We know that the benefit of starting treatment early has better outcomes. -Dr. Burr Medico will slightly reduce the dose of your IV chemotherapy to make sure it is tolerable -Will arrange for chemo education class where you sit down down one-on-one with the nurse educator and she will discuss your specific treatment and a lot more detail. -We will see you on the first day of your treatment in case you have any additional questions. -If you ever have a hard time tolerating the IV chemo, then we can revisit the pill alone.  However Dr. Burr Medico would encourage you to try the pill and the IV medicine since you are young to try and reduce the  risk of recurrence as much as possible.   Medications: -I have sent a prescription for Compazine 10 mg every 6 hours as needed for nausea and vomiting to the pharmacy. -I have sent a prescription for Zofran 8 mg every 8 hours as needed for nausea or vomiting to the pharmacy.  However, would recommend taking this starting at least 3 days after chemotherapy.  The reason why is because you get a similar medication on days of your IV treatment which is similar to this.  Therefore if you go home and you are nauseous within the first 3 days of treatment, this medication is not that helpful because it is already in your system.  Follow up: -We will see you back for follow-up visit with your first cycle of treatment make sure you do not have any additional questions. -Please make sure to look out for a call from the oral chemotherapy pharmacist who is going to call you and go over the education.

## 2022-02-25 NOTE — Progress Notes (Signed)
START ON PATHWAY REGIMEN - Colorectal     A cycle is every 21 days:     Capecitabine      Oxaliplatin   **Always confirm dose/schedule in your pharmacy ordering system**  Patient Characteristics: Postoperative without Neoadjuvant Therapy, M0 (Pathologic Staging), Colon, Stage III, Low Risk (pT1-3, pN1) Tumor Location: Colon Therapeutic Status: Postoperative without Neoadjuvant Therapy, M0 (Pathologic Staging) AJCC M Category: cM0 AJCC T Category: pT3 AJCC N Category: pN1a AJCC 8 Stage Grouping: IIIB Intent of Therapy: Curative Intent, Discussed with Patient

## 2022-02-26 ENCOUNTER — Encounter: Payer: Self-pay | Admitting: Physician Assistant

## 2022-02-26 ENCOUNTER — Encounter: Payer: Self-pay | Admitting: Hematology

## 2022-02-28 ENCOUNTER — Other Ambulatory Visit (HOSPITAL_COMMUNITY): Payer: Self-pay

## 2022-02-28 ENCOUNTER — Other Ambulatory Visit: Payer: Self-pay | Admitting: Hematology

## 2022-02-28 ENCOUNTER — Telehealth: Payer: Self-pay | Admitting: Pharmacist

## 2022-02-28 ENCOUNTER — Other Ambulatory Visit: Payer: Self-pay

## 2022-02-28 DIAGNOSIS — C19 Malignant neoplasm of rectosigmoid junction: Secondary | ICD-10-CM

## 2022-02-28 LAB — CEA (ACCESS): CEA (CHCC): 1.53 ng/mL (ref 0.00–5.00)

## 2022-02-28 MED ORDER — CAPECITABINE 500 MG PO TABS: 850.0000 mg/m2 | ORAL_TABLET | Freq: Two times a day (BID) | ORAL | 0 refills | Status: DC

## 2022-02-28 MED ORDER — CAPECITABINE 500 MG PO TABS
850.0000 mg/m2 | ORAL_TABLET | Freq: Two times a day (BID) | ORAL | 0 refills | Status: DC
  Filled 2022-02-28: qty 112, 14d supply, fill #0
  Filled 2022-03-01: qty 112, 21d supply, fill #0

## 2022-02-28 NOTE — Telephone Encounter (Signed)
Oral Oncology Pharmacist Encounter  Received new prescription for Xeloda (capecitabine) for the adjuvant treatment of stage III colon cancer in conjunction with oxaliplatin, planned duration 4 cycles.  CBC w/ Diff and CMP from 02/25/22 assessed, noted patient with Scr of 1.27 mg/dL (CrCl ~72.5 mL/min). Prescription dose and frequency assessed for appropriateness. Appropriate for therapy initiation.   Current medication list in Epic reviewed, DDIs with Xeloda identified: Category C DDI between Xeloda and Ondansetron due to risk of Qtc prolongation with fluorouracil products. Noted patient only taking PRN and PO route, risk higher with IV administration. No change in therapy warranted at this time.   Evaluated chart and no patient barriers to medication adherence noted.   Patient agreement for treatment documented in MD note on 02/25/22.  Prescription has been e-scribed to the [facility] for benefits analysis and approval.  Oral Oncology Clinic will continue to follow for insurance authorization, copayment issues, initial counseling and start date.  Leron Croak, PharmD, BCPS, [facility] Hematology/Oncology Clinical Pharmacist [facility] [phone] 02/28/2022 10:11 AM

## 2022-03-01 ENCOUNTER — Encounter: Payer: Self-pay | Admitting: Hematology

## 2022-03-01 ENCOUNTER — Other Ambulatory Visit (HOSPITAL_COMMUNITY): Payer: Self-pay

## 2022-03-01 ENCOUNTER — Telehealth: Payer: Self-pay | Admitting: Internal Medicine

## 2022-03-01 ENCOUNTER — Other Ambulatory Visit: Payer: Self-pay

## 2022-03-01 NOTE — Progress Notes (Signed)
Received call from patient's daughter Ashad regarding helping her mom with Medicaid and applying for [employer]. Advised her to follow up with DSS regarding Medicaid status.  Discussed one-time $1000 [employer] grant to assist with personal expenses while going through treatment such as oral medication. Advised what is needed to complete process. She will bring tomorrow 03/02/22 and complete paperwork at registration. She will receive a copy and copy will be scanned to me. She verbalized understanding. Advised her mom will be able to get medication under grant and I will call her when I return from [facility] to discuss the other expenses in detail. She verbalized understanding.   She has my card for any additional financial questions or concerns.

## 2022-03-01 NOTE — Telephone Encounter (Signed)
Oral Chemotherapy Pharmacist Encounter  I spoke with patient's daughter, Betsy Coder, for overview of: Xeloda (capecitabine) for the adjuvant treatment of stage III colon cancer in conjunction with infusional oxaliplatin, planned duration of 4 cycles.   Counseled on administration, dosing, side effects, monitoring, drug-food interactions, safe handling, storage, and disposal.  Patient will take Xeloda '500mg'$  tablets, 4 tablets ('2000mg'$ ) by mouth in AM and 4 tabs ('2000mg'$ ) by mouth in PM, within 30 minutes of finishing meals, on days 1-14 of each 21 day cycle.   Xeloda and oxaliplatin start date: 03/04/22  Adverse effects include but are not limited to: fatigue, decreased blood counts, GI upset, diarrhea, mouth sores, and hand-foot syndrome. Hand-foot syndrome: discussed use of cream such as [place]derly Smooth Extra Care 20 or equivalent advanced care cream that has 20% urea content for advanced skin hydration while on Xeloda Diarrhea: Patient will obtain Imodium (loperamide) to have on hand if they experience diarrhea. Patient knows to alert the office of 4 or more loose stools above baseline.  Reviewed importance of keeping a medication schedule and plan for any missed doses. No barriers to medication adherence identified.  Medication reconciliation performed and medication/allergy list updated.  All questions answered.  Patient's daughter voiced understanding and appreciation.   Medication education handout and calendar will be given to patient in daughter in clinic on 03/02/22. Patient's daughter knows to call the office with questions or concerns. Oral Chemotherapy Clinic phone number provided.  Leron Croak, PharmD, BCPS, BCOP Hematology/Oncology Clinical Pharmacist [facility] [phone] 03/01/2022 3:30 PM

## 2022-03-01 NOTE — Telephone Encounter (Signed)
Scheduled per los and work-queue, patient has been called and notified of upcoming appointments.

## 2022-03-02 ENCOUNTER — Inpatient Hospital Stay: Payer: Medicaid Other

## 2022-03-02 ENCOUNTER — Other Ambulatory Visit: Payer: Self-pay

## 2022-03-02 ENCOUNTER — Other Ambulatory Visit (HOSPITAL_COMMUNITY): Payer: Self-pay

## 2022-03-02 DIAGNOSIS — C19 Malignant neoplasm of rectosigmoid junction: Secondary | ICD-10-CM

## 2022-03-03 MED FILL — Dexamethasone Sodium Phosphate Inj 100 MG/10ML: INTRAMUSCULAR | Qty: 1 | Status: AC

## 2022-03-03 NOTE — Progress Notes (Signed)
[name] OFFICE PROGRESS NOTE  [name], FNP [address] [place] [address]  DIAGNOSIS: Colorectal cancer   Oncology History  Colorectal cancer ([place])  12/12/2021 Procedure   Repeat colonoscopy: -Distal rectal exam is normal and no palpable  -Sigmoid colon: 2 proliferative circumferential almost obstructing lesion.  Adult colonoscope could not pass through this lesion (biopsies taken).  Lesion is at 18 cm from the anal verge -Rigid sigmoidoscope the lesion is 18 cm from the anal verge.  Endoscopic diagnosis: Sigmoid mass almost obstructs the lumen suggestive of malignancy.    12/24/2021 Imaging   CT chest abdomen and pelvis  Conclusion: -1.  Known case of carcinoma of the sigmoid colon.  Segment of the sigmoid colon mural wall thickening with regional small lymph nodes. 2.  No suspicious distant metastatic lesion 3.  There is slight heterogeneous enhancement of the pancreatic head/uncanny process with some fatty changes and fat stranding likely postinflammatory changes, focal pancreatitis/groove pancreatitis   01/05/2022 Pathology Results   Operation: Laparoscopic anterior resection -Tumor size 5.5 x 5 x 1 cm -Histologic grade: G2, moderately differentiated -Tumor extent: Invades through the muscularis propria into pericolonic tissue -All margins are free of invasive carcinoma -Number of lymph nodes involved: 1 -Number of lymph nodes examined: 23  Pathologic staging: PT3 N1a      02/25/2022 Initial Diagnosis   Colorectal cancer ([place])   02/25/2022 Cancer Staging   Staging form: Colon and Rectum, AJCC 8th Edition - Clinical stage from 02/25/2022: Stage IIIB (cT3, cN1a, cM0) - Signed by Nello Corro L, PA-C on 02/25/2022 Total positive nodes: 1 Histologic grade (G): G2 Histologic grading system: 4 grade system   03/04/2022 -  Chemotherapy   Patient is on Treatment Plan : COLORECTAL Xelox (Capeox)(130/850) q21d        CURRENT THERAPY: Starting CAPOX today 03/04/22 with 2000 mg of Xeloda BID on days 1-14 and 7 days off and Oxaliplatin on day 1 of every 3 week cycle.   INTERVAL HISTORY: [name] 47 y.o. female returns to the clinic today for a follow-up visit accompanied by her daughter.  The patient was seen last week on 02/25/2022.  The patient recently moved locally from [place] to be with her children while she undergoes treatment.  She is scheduled to start adjuvant CAPOX today she took her first dose of Xeloda in the morning and so far has tolerated it well.  She had her education class earlier this week and does not have any questions at this time.  She picked up an iron supplement and is tolerating this well.   The patient denies any complaints today.  Denies any fever, chills, night sweats.  Denies any appetite change.  Denies any abdominal pain.  Denies any nausea, vomiting, diarrhea, or constipation.  Denies any blood in the stool.  Denies any jaundice or itching.  Denies any shortness of breath, cough, or hemoptysis.  She is here today for evaluation and repeat blood work before undergoing her first cycle of treatment.  MEDICAL HISTORY:No past medical history on file.  ALLERGIES:  has no allergies on file.  MEDICATIONS:  Current Outpatient Medications  Medication Sig Dispense Refill   capecitabine (XELODA) 500 MG tablet Take 4 tablets (2,000 mg total) by mouth 2 (two) times daily after a meal. Take 14 days on, then 7 days off, repeat every 21 days. 112 tablet 0   ondansetron (ZOFRAN) 8 MG tablet Take 1 tablet (8 mg total) by mouth  every 8 (eight) hours as needed for nausea or vomiting. Starting 3 days after chemotherapy 30 tablet 2   prochlorperazine (COMPAZINE) 10 MG tablet Take 1 tablet (10 mg total) by mouth every 6 (six) hours as needed. 30 tablet 2   No current facility-administered medications for this visit.    SURGICAL HISTORY: No past surgical history on file.  REVIEW OF SYSTEMS:    Review of Systems  Constitutional: Negative for appetite change, chills, fatigue, fever and unexpected weight change.  HENT:   Negative for mouth sores, nosebleeds, sore throat and trouble swallowing.   Eyes: Negative for eye problems and icterus.  Respiratory: Negative for cough, hemoptysis, shortness of breath and wheezing.   Cardiovascular: Negative for chest pain and leg swelling.  Gastrointestinal: Negative for abdominal pain, constipation, diarrhea, nausea and vomiting.  Genitourinary: Negative for bladder incontinence, difficulty urinating, dysuria, frequency and hematuria.   Musculoskeletal: Negative for back pain, gait problem, neck pain and neck stiffness.  Skin: Negative for itching and rash.  Neurological: Negative for dizziness, extremity weakness, gait problem, headaches, light-headedness and seizures.  Hematological: Negative for adenopathy. Does not bruise/bleed easily.  Psychiatric/Behavioral: Negative for confusion, depression and sleep disturbance. The patient is not nervous/anxious.     PHYSICAL EXAMINATION:  Blood pressure 129/87, pulse (!) 107, temperature (!) 97.2 F (36.2 C), resp. rate 17, weight 209 lb (94.8 kg), SpO2 98 %.  ECOG PERFORMANCE STATUS: 0  Physical Exam  Constitutional: Oriented to person, place, and time and well-developed, well-nourished, and in no distress.  HENT:  Head: Normocephalic and atraumatic.  Mouth/Throat: Oropharynx is clear and moist. No oropharyngeal exudate.  Eyes: Conjunctivae are normal. Right eye exhibits no discharge. Left eye exhibits no discharge. No scleral icterus.  Neck: Normal range of motion. Neck supple.  Cardiovascular: Normal rate, regular rhythm, normal heart sounds and intact distal pulses.   Pulmonary/Chest: Effort normal and breath sounds normal. No respiratory distress. No wheezes. No rales.  Abdominal: Soft. Bowel sounds are normal. Exhibits no distension and no mass. There is no tenderness.  Musculoskeletal:  Normal range of motion. Exhibits no edema.  Lymphadenopathy:    No cervical adenopathy.  Neurological: Alert and oriented to person, place, and time. Exhibits normal muscle tone. Gait normal. Coordination normal.  Skin: Skin is warm and dry. No rash noted. Not diaphoretic. No erythema. No pallor.  Psychiatric: Mood, memory and judgment normal.  Vitals reviewed.  LABORATORY DATA: Lab Results  Component Value Date   WBC 6.5 03/04/2022   HGB 11.9 (L) 03/04/2022   HCT 37.8 03/04/2022   MCV 74.9 (L) 03/04/2022   PLT 701 (H) 03/04/2022      Chemistry      Component Value Date/Time   NA 139 03/04/2022 0926   K 4.1 03/04/2022 0926   CL 106 03/04/2022 0926   CO2 24 03/04/2022 0926   BUN 27 (H) 03/04/2022 0926   CREATININE 1.04 (H) 03/04/2022 0926      Component Value Date/Time   CALCIUM 10.2 03/04/2022 0926   ALKPHOS 79 03/04/2022 0926   AST 12 (L) 03/04/2022 0926   ALT 10 03/04/2022 0926   BILITOT 0.2 (L) 03/04/2022 0926       RADIOGRAPHIC STUDIES:  No results found.   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 MMR normal. Diagnosed in November 2023.  -Underwent evaluation in [place] for rectal bleeding which showed  near obstructing sigmoid colon mass.  -CT CAP showed known carcinoma of the sigmoid colon, regional small lymph nodes and no other findings of distant metastatic disease.  -She underwent surgical resection on 01/05/22 that showed 5.5 x 5 x 1 cm mass, grade 2, adenocarcinoma. 1 lymph node of 23 lymph nodes examined was positive for carcinoma. This was staged at T3, N1a, M0. MMR normal  -Given the patient's young age and performance status, Dr. Burr Medico would recommend adjuvant therapy with CAPOX. The patient was concerned with the possible side effect profile so Dr. Burr Medico is going to reduce the dose of Oxaliplatin from cycle #1 to ensure good  tolerance and may consider increasing the dose in the future cycles of she tolerates it ok.  -She is not interested in a port-a-cath.  -Discussed CapOx with oxaliplatin day 1 q. 21 days and Xeloda twice daily on days 1 through 14 q. 21 days.  -Chemotherapy consent: Side effects including but not limited to fatigue, nausea, vomiting, diarrhea and/or constipation, cold sensitivity, neuropathy, hand-foot syndrome, fluid retention, kidney and liver dysfunction, immunosuppression/neutropenic fever, need for blood transfusion, and bleeding, were discussed with patient in great detail. She agrees to proceed. The goal of neoadjuvant chemo is curative.  -Labs were reviewed. She has some thrombocytosis. We will monitor for now. I will check with Dr. Burr Medico to make sure no additional testing is required at this time or if testing would be recommended if it continues to be elevated in the future. She does have some iron deficiency anemia which can cause some thrombocytosis. Recommend she proceed with her first cycle of treatment as scheduled -We will see her back for a follow up in 1 week for toxicity check   Iron Deficiency Anemia -Recommend that the patient take the ferrous sulfate 325 mg tablet p.o. daily. Advised to take this with vitamin C -She does not need any IV iron at this time but we will consider IV iron in the future if needed  Social -Patient is from [place] and is living with her daughters in an apartment locally. Her husband is in [place]. Her daughter is in college and studying biology -She has Medicaid  -She is independent with her activities of daily living -Financial advocate has been working with the patient   Genetics -Given her young age of onset with her cancer diagnosis, she qualifies for genetic counseling. -Referral placed and she has an appointment in March 2024  Plan: -Proceed with cycle #1 today as scheduled -1 Week toxicity check scheduled for next week -Continue iron  supplement -Follow up in 3 weeks with cycle #2.   No orders of the defined types were placed in this encounter.     The total time spent in the appointment was 20-29 minutes.   Joanna Hall L Jaton Eilers, PA-C 03/04/22

## 2022-03-04 ENCOUNTER — Inpatient Hospital Stay: Payer: Medicaid Other | Attending: Physician Assistant

## 2022-03-04 ENCOUNTER — Inpatient Hospital Stay (HOSPITAL_BASED_OUTPATIENT_CLINIC_OR_DEPARTMENT_OTHER): Payer: Medicaid Other | Admitting: Physician Assistant

## 2022-03-04 ENCOUNTER — Inpatient Hospital Stay: Payer: Medicaid Other

## 2022-03-04 ENCOUNTER — Other Ambulatory Visit: Payer: Self-pay

## 2022-03-04 VITALS — BP 129/87 | HR 107 | Temp 97.2°F | Resp 17 | Wt 209.0 lb

## 2022-03-04 VITALS — BP 134/85 | HR 99 | Temp 98.4°F | Resp 18

## 2022-03-04 DIAGNOSIS — Z5111 Encounter for antineoplastic chemotherapy: Secondary | ICD-10-CM | POA: Insufficient documentation

## 2022-03-04 DIAGNOSIS — D75839 Thrombocytosis, unspecified: Secondary | ICD-10-CM | POA: Insufficient documentation

## 2022-03-04 DIAGNOSIS — C19 Malignant neoplasm of rectosigmoid junction: Secondary | ICD-10-CM

## 2022-03-04 DIAGNOSIS — T451X5A Adverse effect of antineoplastic and immunosuppressive drugs, initial encounter: Secondary | ICD-10-CM | POA: Diagnosis not present

## 2022-03-04 DIAGNOSIS — R197 Diarrhea, unspecified: Secondary | ICD-10-CM | POA: Insufficient documentation

## 2022-03-04 DIAGNOSIS — D509 Iron deficiency anemia, unspecified: Secondary | ICD-10-CM | POA: Insufficient documentation

## 2022-03-04 DIAGNOSIS — R110 Nausea: Secondary | ICD-10-CM | POA: Diagnosis not present

## 2022-03-04 DIAGNOSIS — C187 Malignant neoplasm of sigmoid colon: Secondary | ICD-10-CM | POA: Diagnosis present

## 2022-03-04 DIAGNOSIS — R519 Headache, unspecified: Secondary | ICD-10-CM | POA: Diagnosis not present

## 2022-03-04 LAB — CBC WITH DIFFERENTIAL (CANCER CENTER ONLY)
Abs Immature Granulocytes: 0.02 10*3/uL (ref 0.00–0.07)
Basophils Absolute: 0.1 10*3/uL (ref 0.0–0.1)
Basophils Relative: 1 %
Eosinophils Absolute: 0.1 10*3/uL (ref 0.0–0.5)
Eosinophils Relative: 1 %
HCT: 37.8 % (ref 36.0–46.0)
Hemoglobin: 11.9 g/dL — ABNORMAL LOW (ref 12.0–15.0)
Immature Granulocytes: 0 %
Lymphocytes Relative: 37 %
Lymphs Abs: 2.4 10*3/uL (ref 0.7–4.0)
MCH: 23.6 pg — ABNORMAL LOW (ref 26.0–34.0)
MCHC: 31.5 g/dL (ref 30.0–36.0)
MCV: 74.9 fL — ABNORMAL LOW (ref 80.0–100.0)
Monocytes Absolute: 0.3 10*3/uL (ref 0.1–1.0)
Monocytes Relative: 5 %
Neutro Abs: 3.6 10*3/uL (ref 1.7–7.7)
Neutrophils Relative %: 56 %
Platelet Count: 701 10*3/uL — ABNORMAL HIGH (ref 150–400)
RBC: 5.05 MIL/uL (ref 3.87–5.11)
RDW: 17.2 % — ABNORMAL HIGH (ref 11.5–15.5)
WBC Count: 6.5 10*3/uL (ref 4.0–10.5)
nRBC: 0 % (ref 0.0–0.2)

## 2022-03-04 LAB — CMP (CANCER CENTER ONLY)
ALT: 10 U/L (ref 0–44)
AST: 12 U/L — ABNORMAL LOW (ref 15–41)
Albumin: 3.9 g/dL (ref 3.5–5.0)
Alkaline Phosphatase: 79 U/L (ref 38–126)
Anion gap: 9 (ref 5–15)
BUN: 27 mg/dL — ABNORMAL HIGH (ref 6–20)
CO2: 24 mmol/L (ref 22–32)
Calcium: 10.2 mg/dL (ref 8.9–10.3)
Chloride: 106 mmol/L (ref 98–111)
Creatinine: 1.04 mg/dL — ABNORMAL HIGH (ref 0.44–1.00)
GFR, Estimated: >60 mL/min (ref >60)
Glucose, Bld: 102 mg/dL — ABNORMAL HIGH (ref 70–99)
Potassium: 4.1 mmol/L (ref 3.5–5.1)
Sodium: 139 mmol/L (ref 135–145)
Total Bilirubin: 0.2 mg/dL — ABNORMAL LOW (ref 0.3–1.2)
Total Protein: 7.7 g/dL (ref 6.5–8.1)

## 2022-03-04 MED ORDER — PALONOSETRON HCL INJECTION 0.25 MG/5ML
0.2500 mg | Freq: Once | INTRAVENOUS | Status: AC
  Administered 2022-03-04: 0.25 mg via INTRAVENOUS
  Filled 2022-03-04: qty 5

## 2022-03-04 MED ORDER — SODIUM CHLORIDE 0.9 % IV SOLN
10.0000 mg | Freq: Once | INTRAVENOUS | Status: AC
  Administered 2022-03-04: 10 mg via INTRAVENOUS
  Filled 2022-03-04: qty 10

## 2022-03-04 MED ORDER — OXALIPLATIN CHEMO INJECTION 100 MG/20ML
125.0000 mg/m2 | Freq: Once | INTRAVENOUS | Status: AC
  Administered 2022-03-04: 300 mg via INTRAVENOUS
  Filled 2022-03-04: qty 5.56

## 2022-03-04 MED ORDER — DEXTROSE 5 % IV SOLN: Freq: Once | INTRAVENOUS | Status: AC

## 2022-03-04 NOTE — Progress Notes (Signed)
Upon completion of Oxaliplatin infusion patient states she has a some tingling in both hands, both feet, and mouth, but also states eye lids feel tingly. Pt's vitals signs are stable. Discharge paper work with instructions reviewed and Eloise Harman., PA was informed and came to see patient at bedside. After several minutes, pt's symptoms started to subside. Dr. Burr Medico aware and Pt. Was  informed that her symptoms are side effect from her treatment. [place] for discharge, pt's vital signs stable and ambulatory accompanied by her daughter.

## 2022-03-04 NOTE — Patient Instructions (Signed)
[facility]  Discharge Instructions: Thank you for choosing [facility] to provide your oncology and hematology care.   If you have a lab appointment with the [facility], please go directly to the [facility] and check in at the registration area.   Wear comfortable clothing and clothing appropriate for easy access to any Portacath or PICC line.   We strive to give you quality time with your provider. You may need to reschedule your appointment if you arrive late (15 or more minutes).  Arriving late affects you and other patients whose appointments are after yours.  Also, if you miss three or more appointments without notifying the office, you may be dismissed from the clinic at the provider's discretion.      For prescription refill requests, have your pharmacy contact our office and allow 72 hours for refills to be completed.    Today you received the following chemotherapy and/or immunotherapy agents: Oxaliplatin      To help prevent nausea and vomiting after your treatment, we encourage you to take your nausea medication as directed.  BELOW ARE SYMPTOMS THAT SHOULD BE REPORTED IMMEDIATELY: *FEVER GREATER THAN 100.4 F (38 C) OR HIGHER *CHILLS OR SWEATING *NAUSEA AND VOMITING THAT IS NOT CONTROLLED WITH YOUR NAUSEA MEDICATION *UNUSUAL SHORTNESS OF BREATH *UNUSUAL BRUISING OR BLEEDING *URINARY PROBLEMS (pain or burning when urinating, or frequent urination) *BOWEL PROBLEMS (unusual diarrhea, constipation, pain near the anus) TENDERNESS IN MOUTH AND THROAT WITH OR WITHOUT PRESENCE OF ULCERS (sore throat, sores in mouth, or a toothache) UNUSUAL RASH, SWELLING OR PAIN  UNUSUAL VAGINAL DISCHARGE OR ITCHING   Items with * indicate a potential emergency and should be followed up as soon as possible or go to the Emergency Department if any problems should occur.  Please show the CHEMOTHERAPY ALERT CARD or IMMUNOTHERAPY ALERT CARD at  check-in to the Emergency Department and triage nurse.  Should you have questions after your visit or need to cancel or reschedule your appointment, please contact [facility]  Dept: [phone]  and follow the prompts.  Office hours are 8:00 a.m. to 4:30 p.m. Monday - Friday. Please note that voicemails left after 4:00 p.m. may not be returned until the following business day.  We are closed weekends and major holidays. You have access to a nurse at all times for urgent questions. Please call the main number to the clinic Dept: [phone] and follow the prompts.   For any non-urgent questions, you may also contact your provider using MyChart. We now offer e-Visits for anyone 74 and older to request care online for non-urgent symptoms. For details visit [email or web address].   Also download the MyChart app! Go to the app store, search "MyChart", open the app, select [facility], and log in with your MyChart username and password.  Oxaliplatin Injection What is this medication? OXALIPLATIN (ox AL i PLA tin) treats some types of cancer. It works by slowing down the growth of cancer cells. This medicine may be used for other purposes; ask your health care provider or pharmacist if you have questions. COMMON BRAND NAME(S): Eloxatin What should I tell my care team before I take this medication? They need to know if you have any of these conditions: Heart disease History of irregular heartbeat or rhythm Liver disease Low blood cell levels (white cells, red cells, and platelets) Lung or breathing disease, such as asthma Take medications that treat or prevent  blood clots Tingling of the fingers, toes, or other nerve disorder An unusual or allergic reaction to oxaliplatin, other medications, foods, dyes, or preservatives If you or your partner are pregnant or trying to get pregnant Breast-feeding How should I use this medication? This medication is  injected into a vein. It is given by your care team in a hospital or clinic setting. Talk to your care team about the use of this medication in children. Special care may be needed. Overdosage: If you think you have taken too much of this medicine contact a poison control center or emergency room at once. NOTE: This medicine is only for you. Do not share this medicine with others. What if I miss a dose? Keep appointments for follow-up doses. It is important not to miss a dose. Call your care team if you are unable to keep an appointment. What may interact with this medication? Do not take this medication with any of the following: Cisapride Dronedarone Pimozide Thioridazine This medication may also interact with the following: Aspirin and aspirin-like medications Certain medications that treat or prevent blood clots, such as warfarin, apixaban, dabigatran, and rivaroxaban Cisplatin Cyclosporine Diuretics Medications for infection, such as acyclovir, adefovir, amphotericin B, bacitracin, cidofovir, foscarnet, ganciclovir, gentamicin, pentamidine, vancomycin NSAIDs, medications for pain and inflammation, such as ibuprofen or naproxen Other medications that cause heart rhythm changes Pamidronate Zoledronic acid This list may not describe all possible interactions. Give your health care provider a list of all the medicines, herbs, non-prescription drugs, or dietary supplements you use. Also tell them if you smoke, drink alcohol, or use illegal drugs. Some items may interact with your medicine. What should I watch for while using this medication? Your condition will be monitored carefully while you are receiving this medication. You may need blood work while taking this medication. This medication may make you feel generally unwell. This is not uncommon as chemotherapy can affect healthy cells as well as cancer cells. Report any side effects. Continue your course of treatment even though you  feel ill unless your care team tells you to stop. This medication may increase your risk of getting an infection. Call your care team for advice if you get a fever, chills, sore throat, or other symptoms of a cold or flu. Do not treat yourself. Try to avoid being around people who are sick. Avoid taking medications that contain aspirin, acetaminophen, ibuprofen, naproxen, or ketoprofen unless instructed by your care team. These medications may hide a fever. Be careful brushing or flossing your teeth or using a toothpick because you may get an infection or bleed more easily. If you have any dental work done, tell your dentist you are receiving this medication. This medication can make you more sensitive to cold. Do not drink cold drinks or use ice. Cover exposed skin before coming in contact with cold temperatures or cold objects. When out in cold weather wear warm clothing and cover your mouth and nose to warm the air that goes into your lungs. Tell your care team if you get sensitive to the cold. Talk to your care team if you or your partner are pregnant or think either of you might be pregnant. This medication can cause serious birth defects if taken during pregnancy and for 9 months after the last dose. A negative pregnancy test is required before starting this medication. A reliable form of contraception is recommended while taking this medication and for 9 months after the last dose. Talk to your care team about  effective forms of contraception. Do not father a child while taking this medication and for 6 months after the last dose. Use a condom while having sex during this time period. Do not breastfeed while taking this medication and for 3 months after the last dose. This medication may cause infertility. Talk to your care team if you are concerned about your fertility. What side effects may I notice from receiving this medication? Side effects that you should report to your care team as soon as  possible: Allergic reactions--skin rash, itching, hives, swelling of the face, lips, tongue, or throat Bleeding--bloody or black, tar-like stools, vomiting blood or brown material that looks like coffee grounds, red or dark brown urine, small red or purple spots on skin, unusual bruising or bleeding Dry cough, shortness of breath or trouble breathing Heart rhythm changes--fast or irregular heartbeat, dizziness, feeling faint or lightheaded, chest pain, trouble breathing Infection--fever, chills, cough, sore throat, wounds that don't heal, pain or trouble when passing urine, general feeling of discomfort or being unwell Liver injury--right upper belly pain, loss of appetite, nausea, light-colored stool, dark yellow or brown urine, yellowing skin or eyes, unusual weakness or fatigue Low red blood cell level--unusual weakness or fatigue, dizziness, headache, trouble breathing Muscle injury--unusual weakness or fatigue, muscle pain, dark yellow or brown urine, decrease in amount of urine Pain, tingling, or numbness in the hands or feet Sudden and severe headache, confusion, change in vision, seizures, which may be signs of posterior reversible encephalopathy syndrome (PRES) Unusual bruising or bleeding Side effects that usually do not require medical attention (report to your care team if they continue or are bothersome): Diarrhea Nausea Pain, redness, or swelling with sores inside the mouth or throat Unusual weakness or fatigue Vomiting This list may not describe all possible side effects. Call your doctor for medical advice about side effects. You may report side effects to FDA at 1-800-FDA-1088. Where should I keep my medication? This medication is given in a hospital or clinic. It will not be stored at home. NOTE: This sheet is a summary. It may not cover all possible information. If you have questions about this medicine, talk to your doctor, pharmacist, or health care provider.  2023  Elsevier/Gold Standard (2007-03-10 00:00:00)

## 2022-03-04 NOTE — Progress Notes (Signed)
Oxaliplatin dose slightly outside of 10% will continue for today.  Larene Beach, PharmD

## 2022-03-08 ENCOUNTER — Other Ambulatory Visit: Payer: Self-pay

## 2022-03-08 ENCOUNTER — Telehealth: Payer: Self-pay | Admitting: *Deleted

## 2022-03-08 NOTE — Telephone Encounter (Signed)
-----   Message from Kem Kays, RN sent at 03/04/2022  4:29 PM EST ----- Regarding: Dr. Burr Medico patient/ First time Oxaliplatin Pt tolerated  1st time Oxaliplatin with minimal side effects. Anda Kraft, PA  from symptom management called to see patient due to experiencing some tingling on her eye lids, as well as both hands, feet and inside her mouth.  Dr. Burr Medico aware.

## 2022-03-08 NOTE — Telephone Encounter (Signed)
Called to check on pt in regards to recent treatment.  Spoke with daughter, Betsy Coder & she reports that pt is doing well.  She is taking her capecitabine & had diarrhea yesterday & states that they spoke to nurse & was told to take imodium & this has helped. She denies any other problems & knows how to reach [place] if needed & Knows her next appt.

## 2022-03-09 ENCOUNTER — Other Ambulatory Visit: Payer: Self-pay

## 2022-03-09 DIAGNOSIS — C19 Malignant neoplasm of rectosigmoid junction: Secondary | ICD-10-CM

## 2022-03-09 NOTE — Progress Notes (Signed)
The proposed treatment discussed in conference is for discussion purpose only and is not a binding recommendation.  The patients have not been physically examined, or presented with their treatment options.  Therefore, final treatment plans cannot be decided.  

## 2022-03-09 NOTE — Progress Notes (Unsigned)
[facility]   [phone] [phone]   Clinic Follow up Note   Patient Care Team: [name], FNP as PCP - General (Family Medicine) Truitt Merle, MD as Consulting Physician (Oncology)  Date of Service:  03/10/2022  CHIEF COMPLAINT: f/u of Colorectal cancer   CURRENT THERAPY:  Starting CAPOX today 03/04/22 with 2000 mg of Xeloda BID on days 1-14 and 7 days off and Oxaliplatin on day 1 of every 3 week cycle.    ASSESSMENT:  [name] is a 47 y.o. 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 MMR normal. Diagnosed in November 2023.  -Underwent evaluation in [place] for rectal bleeding which showed near obstructing sigmoid colon mass.  -CT CAP showed known carcinoma of the sigmoid colon, regional small lymph nodes and no other findings of distant metastatic disease.  -She underwent surgical resection on 01/05/22 that showed 5.5 x 5 x 1 cm mass, grade 2, adenocarcinoma. 1 lymph node of 23 lymph nodes examined was positive for carcinoma. This was staged at T3, N1a, M0. MMR normal  -I recommend adjuvant chemotherapy CapeOx, she started on 03/04/22 -She tolerated first treatment moderately well, with some nausea, photosensitivity, and diarrhea.  We again reviewed the management of the symptoms.   PLAN: -lab reviewed stable -Continue Oral Iron Pill  Recommend increasing the Zofran for Nausea to 3 times a day before meals and to use Compazine as needed in addition to Zofran -lab/flush and f/u CapeOX -03/25/2022    SUMMARY OF ONCOLOGIC HISTORY: Oncology History  Colorectal cancer ([place])  12/12/2021 Procedure   Repeat colonoscopy: -Distal rectal exam is normal and no palpable  -Sigmoid colon: 2 proliferative circumferential almost obstructing lesion.  Adult colonoscope could not pass through this lesion (biopsies taken).  Lesion is at 18 cm from the anal  verge -Rigid sigmoidoscope the lesion is 18 cm from the anal verge.  Endoscopic diagnosis: Sigmoid mass almost obstructs the lumen suggestive of malignancy.    12/24/2021 Imaging   CT chest abdomen and pelvis  Conclusion: -1.  Known case of carcinoma of the sigmoid colon.  Segment of the sigmoid colon mural wall thickening with regional small lymph nodes. 2.  No suspicious distant metastatic lesion 3.  There is slight heterogeneous enhancement of the pancreatic head/uncanny process with some fatty changes and fat stranding likely postinflammatory changes, focal pancreatitis/groove pancreatitis   01/05/2022 Pathology Results   Operation: Laparoscopic anterior resection -Tumor size 5.5 x 5 x 1 cm -Histologic grade: G2, moderately differentiated -Tumor extent: Invades through the muscularis propria into pericolonic tissue -All margins are free of invasive carcinoma -Number of lymph nodes involved: 1 -Number of lymph nodes examined: 23  Pathologic staging: PT3 N1a      02/25/2022 Initial Diagnosis   Colorectal cancer ([place])   02/25/2022 Cancer Staging   Staging form: Colon and Rectum, AJCC 8th Edition - Clinical stage from 02/25/2022: Stage IIIB (cT3, cN1a, cM0) - Signed by Heilingoetter, Cassandra L, PA-C on 02/25/2022 Total positive nodes: 1 Histologic grade (G): G2 Histologic grading system: 4 grade system   03/04/2022 -  Chemotherapy   Patient is on Treatment Plan : COLORECTAL Xelox (Capeox)(130/850) q21d        INTERVAL HISTORY:  [name] is here for a follow up of  Colorectal cancer She was last seen by PA-C Cassie on 03/04/2022 She presents to the clinic accompanied by family member.  Pt states  she has some cold sensitivity in hands, Pt states she has been having diarrhea at least 4 times a day and is Taking imodium. Pt also has some nausea and is taking the nausea medicine. Pt appetite is good. Pt denies having any pain. No skin peeling from the chemo Oral pill Xeloda.  Pt states she is taking an OTC oral iron pil.     All other systems were reviewed with the patient and are negative.  MEDICAL HISTORY:  History reviewed. No pertinent past medical history.  SURGICAL HISTORY: History reviewed. No pertinent surgical history.  I have reviewed the social history and family history with the patient and they are unchanged from previous note.  ALLERGIES:  has no allergies on file.  MEDICATIONS:  Current Outpatient Medications  Medication Sig Dispense Refill   capecitabine (XELODA) 500 MG tablet Take 4 tablets (2,000 mg total) by mouth 2 (two) times daily after a meal. Take 14 days on, then 7 days off, repeat every 21 days. 112 tablet 0   ondansetron (ZOFRAN) 8 MG tablet Take 1 tablet (8 mg total) by mouth every 8 (eight) hours as needed for nausea or vomiting. Starting 3 days after chemotherapy 30 tablet 2   prochlorperazine (COMPAZINE) 10 MG tablet Take 1 tablet (10 mg total) by mouth every 6 (six) hours as needed. 30 tablet 2   No current facility-administered medications for this visit.    PHYSICAL EXAMINATION: ECOG PERFORMANCE STATUS: 1 - Symptomatic but completely ambulatory  Vitals:   03/10/22 0820  BP: 113/83  Pulse: (!) 108  Resp: 18  Temp: 98.1 F (36.7 C)  SpO2: 97%   Wt Readings from Last 3 Encounters:  03/10/22 206 lb 11.2 oz (93.8 kg)  03/04/22 209 lb (94.8 kg)  02/25/22 278 lb 12.8 oz (126.5 kg)      GENERAL:alert, no distress and comfortable SKIN: skin color normal, no rashes or significant lesions EYES: normal, Conjunctiva are pink and non-injected, sclera clear  NEURO: alert & oriented x 3 with fluent speech   LABORATORY DATA:  I have reviewed the data as listed    Latest Ref Rng & Units 03/10/2022    7:39 AM 03/04/2022    9:26 AM 02/25/2022    2:51 PM  CBC  WBC 4.0 - 10.5 K/uL 7.9  6.5  8.4   Hemoglobin 12.0 - 15.0 g/dL 11.2  11.9  11.5   Hematocrit 36.0 - 46.0 % 35.2  37.8  36.7   Platelets 150 - 400 K/uL 513  701   728         Latest Ref Rng & Units 03/10/2022    7:39 AM 03/04/2022    9:26 AM 02/25/2022    2:51 PM  CMP  Glucose 70 - 99 mg/dL 154  102  158   BUN 6 - 20 mg/dL '20  27  30   '$ Creatinine 0.44 - 1.00 mg/dL 1.18  1.04  1.27   Sodium 135 - 145 mmol/L 137  139  138   Potassium 3.5 - 5.1 mmol/L 4.1  4.1  4.1   Chloride 98 - 111 mmol/L 106  106  107   CO2 22 - 32 mmol/L '22  24  22   '$ Calcium 8.9 - 10.3 mg/dL 9.6  10.2  9.7   Total Protein 6.5 - 8.1 g/dL 7.2  7.7  8.2   Total Bilirubin 0.3 - 1.2 mg/dL 0.3  0.2  0.1   Alkaline Phos 38 - 126 U/L 69  79  79   AST 15 - 41 U/L '18  12  12   '$ ALT 0 - 44 U/L '14  10  10       '$ RADIOGRAPHIC STUDIES: I have personally reviewed the radiological images as listed and agreed with the findings in the report. No results found.    Orders Placed This Encounter  Procedures   Ferritin    Standing Status:   Standing    Number of Occurrences:   20    Standing Expiration Date:   03/11/2023   All questions were answered. The patient knows to call the clinic with any problems, questions or concerns. No barriers to learning was detected. The total time spent in the appointment was 30 minutes.     Truitt Merle, MD 03/10/2022   Felicity Coyer, CMA, am acting as scribe for Truitt Merle, MD.   I have reviewed the above documentation for accuracy and completeness, and I agree with the above.

## 2022-03-10 ENCOUNTER — Encounter: Payer: Self-pay | Admitting: Hematology

## 2022-03-10 ENCOUNTER — Inpatient Hospital Stay (HOSPITAL_BASED_OUTPATIENT_CLINIC_OR_DEPARTMENT_OTHER): Payer: Medicaid Other | Admitting: Hematology

## 2022-03-10 ENCOUNTER — Inpatient Hospital Stay: Payer: Medicaid Other

## 2022-03-10 VITALS — BP 113/83 | HR 108 | Temp 98.1°F | Resp 18 | Ht 65.0 in | Wt 206.7 lb

## 2022-03-10 DIAGNOSIS — C19 Malignant neoplasm of rectosigmoid junction: Secondary | ICD-10-CM

## 2022-03-10 DIAGNOSIS — D500 Iron deficiency anemia secondary to blood loss (chronic): Secondary | ICD-10-CM | POA: Diagnosis not present

## 2022-03-10 DIAGNOSIS — Z5111 Encounter for antineoplastic chemotherapy: Secondary | ICD-10-CM | POA: Diagnosis not present

## 2022-03-10 LAB — CMP (CANCER CENTER ONLY)
ALT: 14 U/L (ref 0–44)
AST: 18 U/L (ref 15–41)
Albumin: 3.6 g/dL (ref 3.5–5.0)
Alkaline Phosphatase: 69 U/L (ref 38–126)
Anion gap: 9 (ref 5–15)
BUN: 20 mg/dL (ref 6–20)
CO2: 22 mmol/L (ref 22–32)
Calcium: 9.6 mg/dL (ref 8.9–10.3)
Chloride: 106 mmol/L (ref 98–111)
Creatinine: 1.18 mg/dL — ABNORMAL HIGH (ref 0.44–1.00)
GFR, Estimated: 57 mL/min — ABNORMAL LOW (ref >60)
Glucose, Bld: 154 mg/dL — ABNORMAL HIGH (ref 70–99)
Potassium: 4.1 mmol/L (ref 3.5–5.1)
Sodium: 137 mmol/L (ref 135–145)
Total Bilirubin: 0.3 mg/dL (ref 0.3–1.2)
Total Protein: 7.2 g/dL (ref 6.5–8.1)

## 2022-03-10 LAB — CBC WITH DIFFERENTIAL (CANCER CENTER ONLY)
Abs Immature Granulocytes: 0.03 10*3/uL (ref 0.00–0.07)
Basophils Absolute: 0 10*3/uL (ref 0.0–0.1)
Basophils Relative: 0 %
Eosinophils Absolute: 0.1 10*3/uL (ref 0.0–0.5)
Eosinophils Relative: 2 %
HCT: 35.2 % — ABNORMAL LOW (ref 36.0–46.0)
Hemoglobin: 11.2 g/dL — ABNORMAL LOW (ref 12.0–15.0)
Immature Granulocytes: 0 %
Lymphocytes Relative: 26 %
Lymphs Abs: 2.1 10*3/uL (ref 0.7–4.0)
MCH: 23.7 pg — ABNORMAL LOW (ref 26.0–34.0)
MCHC: 31.8 g/dL (ref 30.0–36.0)
MCV: 74.4 fL — ABNORMAL LOW (ref 80.0–100.0)
Monocytes Absolute: 0.6 10*3/uL (ref 0.1–1.0)
Monocytes Relative: 8 %
Neutro Abs: 5.1 10*3/uL (ref 1.7–7.7)
Neutrophils Relative %: 64 %
Platelet Count: 513 10*3/uL — ABNORMAL HIGH (ref 150–400)
RBC: 4.73 MIL/uL (ref 3.87–5.11)
RDW: 16.1 % — ABNORMAL HIGH (ref 11.5–15.5)
WBC Count: 7.9 10*3/uL (ref 4.0–10.5)
nRBC: 0 % (ref 0.0–0.2)

## 2022-03-10 NOTE — Assessment & Plan Note (Addendum)
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 MMR normal. Diagnosed in November 2023.  -Underwent evaluation in [place] for rectal bleeding which showed near obstructing sigmoid colon mass.  -CT CAP showed known carcinoma of the sigmoid colon, regional small lymph nodes and no other findings of distant metastatic disease.  -She underwent surgical resection on 01/05/22 that showed 5.5 x 5 x 1 cm mass, grade 2, adenocarcinoma. 1 lymph node of 23 lymph nodes examined was positive for carcinoma. This was staged at T3, N1a, M0. MMR normal  -I recommend adjuvant chemotherapy CapeOx, she started on 03/04/22

## 2022-03-11 ENCOUNTER — Encounter: Payer: Self-pay | Admitting: Hematology

## 2022-03-11 NOTE — Progress Notes (Signed)
Patient provided documents for grant on 03/10/22 at check-in registration.  Patient approved for one-time $1000 [employer] grant to assist with personal expenses while going through treatment. She received a copy of the approval letter and expense sheet along with the outpatient pharmacy information.  Called patient's daughter Cyd Silence today to discuss expense sheet in detail. She had expense sheet before her to follow along. Explained the expenses and how they are submitted. She verbalized understanding.  She has my card for any additional financial questions or concerns.

## 2022-03-11 NOTE — Progress Notes (Signed)
Patient provided documents for [name] while I was on PAL.  Patient approved for one-time $1000 [employer] grant to assist with personal expenses while going through treatment. A copy of the approval letter and expense sheet were given.

## 2022-03-15 ENCOUNTER — Other Ambulatory Visit (HOSPITAL_COMMUNITY): Payer: Self-pay

## 2022-03-15 LAB — SURGICAL PATHOLOGY

## 2022-03-16 ENCOUNTER — Other Ambulatory Visit (HOSPITAL_COMMUNITY): Payer: Self-pay

## 2022-03-18 ENCOUNTER — Other Ambulatory Visit (HOSPITAL_COMMUNITY): Payer: Self-pay

## 2022-03-21 ENCOUNTER — Encounter: Payer: Self-pay | Admitting: Hematology

## 2022-03-21 ENCOUNTER — Other Ambulatory Visit: Payer: Self-pay

## 2022-03-21 ENCOUNTER — Other Ambulatory Visit: Payer: Self-pay | Admitting: Hematology

## 2022-03-21 ENCOUNTER — Other Ambulatory Visit (HOSPITAL_COMMUNITY): Payer: Self-pay

## 2022-03-21 DIAGNOSIS — C19 Malignant neoplasm of rectosigmoid junction: Secondary | ICD-10-CM

## 2022-03-21 MED ORDER — CAPECITABINE 500 MG PO TABS
2000.0000 mg | ORAL_TABLET | Freq: Two times a day (BID) | ORAL | 0 refills | Status: DC
  Filled 2022-03-21: qty 112, 21d supply, fill #0

## 2022-03-22 ENCOUNTER — Other Ambulatory Visit (HOSPITAL_COMMUNITY): Payer: Self-pay

## 2022-03-23 NOTE — Progress Notes (Signed)
[place]   Telephone:([phone] Fax:([phone]   Clinic Follow up Note   Patient Care Team: [name], FNP as PCP - General (Family Medicine) Truitt Merle, MD as Consulting Physician (Oncology)  Date of Service:  03/25/2022  CHIEF COMPLAINT: f/u of Colorectal cancer     CURRENT THERAPY:  Xeloda 3 tablet 2 times daily. 14 days on and 7 days off, Start Xeloda 03/28/2022  ASSESSMENT:  [name] is a 47 y.o. female with   Colorectal cancer ([place]) grade 2, Stage III (pT3, N1a, M0). MMR normal. Diagnosed in November 2023.  -Underwent evaluation in [place] for rectal bleeding which showed near obstructing sigmoid colon mass.  -CT CAP showed known carcinoma of the sigmoid colon, regional small lymph nodes and no other findings of distant metastatic disease.  -She underwent surgical resection on 01/05/22 that showed 5.5 x 5 x 1 cm mass, grade 2, adenocarcinoma. 1 lymph node of 23 lymph nodes examined was positive for carcinoma. This was staged at T3, N1a, M0. MMR normal  -I recommend adjuvant chemotherapy CapeOx, she started on 03/04/22 -She had recurrent nausea, no appetite after this for cycle chemotherapy, improved last week.  However she declines further more chemo.  After lengthy discussion about the benefit and potential side effect management, including dose adjustment, she agreed with oral chemo capecitabine alone.  Will reduce capecitabine to 1500 mg twice daily for day 1-14 every 21 days for cycle 2, which she will start next Monday   Nausea and diarrhea -Secondary to chemotherapy -Again reviewed the management of nausea and diarrhea with medications.   PLAN: - recommend using antinausea medication - New prescription of  Xeloda 3 tablet 2 times daily. 14 days on and 7 days off, called in today  - lab reviewed - Start cycle 2 chemo with Xeloda alone on 03/28/2022   SUMMARY OF ONCOLOGIC HISTORY: Oncology History Overview Note   Cancer Staging   Colorectal cancer [facility]) Staging form: Colon and Rectum, AJCC 8th Edition - Clinical stage from 02/25/2022: Stage IIIB (cT3, cN1a, cM0) - Signed by Heilingoetter, Cassandra L, PA-C on 02/25/2022 Total positive nodes: 1 Histologic grade (G): G2 Histologic grading system: 4 grade system     Colorectal cancer ([place])  12/12/2021 Procedure   Repeat colonoscopy: -Distal rectal exam is normal and no palpable  -Sigmoid colon: 2 proliferative circumferential almost obstructing lesion.  Adult colonoscope could not pass through this lesion (biopsies taken).  Lesion is at 18 cm from the anal verge -Rigid sigmoidoscope the lesion is 18 cm from the anal verge.  Endoscopic diagnosis: Sigmoid mass almost obstructs the lumen suggestive of malignancy.    12/24/2021 Imaging   CT chest abdomen and pelvis  Conclusion: -1.  Known case of carcinoma of the sigmoid colon.  Segment of the sigmoid colon mural wall thickening with regional small lymph nodes. 2.  No suspicious distant metastatic lesion 3.  There is slight heterogeneous enhancement of the pancreatic head/uncanny process with some fatty changes and fat stranding likely postinflammatory changes, focal pancreatitis/groove pancreatitis   01/05/2022 Pathology Results   Operation: Laparoscopic anterior resection -Tumor size 5.5 x 5 x 1 cm -Histologic grade: G2, moderately differentiated -Tumor extent: Invades through the muscularis propria into pericolonic tissue -All margins are free of invasive carcinoma -Number of lymph nodes involved: 1 -Number of lymph nodes examined: 23  Pathologic staging: PT3 N1a      02/25/2022 Initial Diagnosis   Colorectal cancer ([place])   02/25/2022 Cancer Staging  Staging form: Colon and Rectum, AJCC 8th Edition - Clinical stage from 02/25/2022: Stage IIIB (cT3, cN1a, cM0) - Signed by Heilingoetter, Cassandra L, PA-C on 02/25/2022 Total positive nodes: 1 Histologic grade (G): G2 Histologic grading system: 4 grade  system   03/04/2022 - 03/04/2022 Chemotherapy   Patient is on Treatment Plan : COLORECTAL Xelox (Capeox)(130/850) q21d        INTERVAL HISTORY:  [name] is here for a follow up of Colorectal cancer    She was last seen by me on 03/10/2022 She presents to the clinic accompanied by her daughter. Pt sates that the first cycle of chemo went well, but she is experiencing headaches. Pt states that it last about a hour. Pt state she wants to stop chemo due to appetite and nausea. Pt state it was worst after the IV chemo. Pt states she has diarrhea 3 times a day. Pt state she is taking Imodium.     All other systems were reviewed with the patient and are negative.  MEDICAL HISTORY:  History reviewed. No pertinent past medical history.  SURGICAL HISTORY: History reviewed. No pertinent surgical history.  I have reviewed the social history and family history with the patient and they are unchanged from previous note.  ALLERGIES:  has no allergies on file.  MEDICATIONS:  Current Outpatient Medications  Medication Sig Dispense Refill   ondansetron (ZOFRAN) 8 MG tablet Take 1 tablet (8 mg total) by mouth every 8 (eight) hours as needed for nausea or vomiting. Starting 3 days after chemotherapy 30 tablet 2   prochlorperazine (COMPAZINE) 10 MG tablet Take 1 tablet (10 mg total) by mouth every 6 (six) hours as needed. 30 tablet 2   No current facility-administered medications for this visit.    PHYSICAL EXAMINATION: ECOG PERFORMANCE STATUS: 1 - Symptomatic but completely ambulatory  Vitals:   03/25/22 0813  BP: 116/80  Pulse: 86  Resp: 18  Temp: (!) 97.5 F (36.4 C)  SpO2: 97%   Wt Readings from Last 3 Encounters:  03/25/22 203 lb 11.2 oz (92.4 kg)  03/10/22 206 lb 11.2 oz (93.8 kg)  03/04/22 209 lb (94.8 kg)     SKIN:(-)  skin color, texture, turgor are normal, no rashes or significant lesions   LABORATORY DATA:  I have reviewed the data as listed    Latest Ref Rng &  Units 03/25/2022    8:01 AM 03/10/2022    7:39 AM 03/04/2022    9:26 AM  CBC  WBC 4.0 - 10.5 K/uL 5.6  7.9  6.5   Hemoglobin 12.0 - 15.0 g/dL 12.2  11.2  11.9   Hematocrit 36.0 - 46.0 % 39.3  35.2  37.8   Platelets 150 - 400 K/uL 481  513  701         Latest Ref Rng & Units 03/25/2022    8:01 AM 03/10/2022    7:39 AM 03/04/2022    9:26 AM  CMP  Glucose 70 - 99 mg/dL 139  154  102   BUN 6 - 20 mg/dL '20  20  27   '$ Creatinine 0.44 - 1.00 mg/dL 1.05  1.18  1.04   Sodium 135 - 145 mmol/L 141  137  139   Potassium 3.5 - 5.1 mmol/L 4.0  4.1  4.1   Chloride 98 - 111 mmol/L 109  106  106   CO2 22 - 32 mmol/L '23  22  24   '$ Calcium 8.9 - 10.3 mg/dL 9.4  9.6  10.2   Total Protein 6.5 - 8.1 g/dL 8.0  7.2  7.7   Total Bilirubin 0.3 - 1.2 mg/dL 0.4  0.3  0.2   Alkaline Phos 38 - 126 U/L 67  69  79   AST 15 - 41 U/L '22  18  12   '$ ALT 0 - 44 U/L '21  14  10       '$ RADIOGRAPHIC STUDIES: I have personally reviewed the radiological images as listed and agreed with the findings in the report. No results found.    No orders of the defined types were placed in this encounter.  All questions were answered. The patient knows to call the clinic with any problems, questions or concerns. No barriers to learning was detected. The total time spent in the appointment was 40 minutes.     Truitt Merle, MD 03/25/2022   Felicity Coyer, CMA, am acting as scribe for Truitt Merle, MD.   I have reviewed the above documentation for accuracy and completeness, and I agree with the above.

## 2022-03-24 MED FILL — Dexamethasone Sodium Phosphate Inj 100 MG/10ML: INTRAMUSCULAR | Qty: 1 | Status: AC

## 2022-03-24 NOTE — Assessment & Plan Note (Signed)
grade 2, Stage III (pT3, N1a, M0). MMR normal. Diagnosed in November 2023.  -Underwent evaluation in [place] for rectal bleeding which showed near obstructing sigmoid colon mass.  -CT CAP showed known carcinoma of the sigmoid colon, regional small lymph nodes and no other findings of distant metastatic disease.  -She underwent surgical resection on 01/05/22 that showed 5.5 x 5 x 1 cm mass, grade 2, adenocarcinoma. 1 lymph node of 23 lymph nodes examined was positive for carcinoma. This was staged at T3, N1a, M0. MMR normal  -I recommend adjuvant chemotherapy CapeOx, she started on 03/04/22

## 2022-03-25 ENCOUNTER — Inpatient Hospital Stay (HOSPITAL_BASED_OUTPATIENT_CLINIC_OR_DEPARTMENT_OTHER): Payer: Medicaid Other | Admitting: Hematology

## 2022-03-25 ENCOUNTER — Other Ambulatory Visit (HOSPITAL_COMMUNITY): Payer: Self-pay

## 2022-03-25 ENCOUNTER — Inpatient Hospital Stay: Payer: Medicaid Other

## 2022-03-25 ENCOUNTER — Encounter: Payer: Self-pay | Admitting: Hematology

## 2022-03-25 VITALS — BP 116/80 | HR 86 | Temp 97.5°F | Resp 18 | Ht 65.0 in | Wt 203.7 lb

## 2022-03-25 DIAGNOSIS — C19 Malignant neoplasm of rectosigmoid junction: Secondary | ICD-10-CM

## 2022-03-25 DIAGNOSIS — D500 Iron deficiency anemia secondary to blood loss (chronic): Secondary | ICD-10-CM

## 2022-03-25 DIAGNOSIS — Z5111 Encounter for antineoplastic chemotherapy: Secondary | ICD-10-CM | POA: Diagnosis not present

## 2022-03-25 LAB — CMP (CANCER CENTER ONLY)
ALT: 21 U/L (ref 0–44)
AST: 22 U/L (ref 15–41)
Albumin: 3.9 g/dL (ref 3.5–5.0)
Alkaline Phosphatase: 67 U/L (ref 38–126)
Anion gap: 9 (ref 5–15)
BUN: 20 mg/dL (ref 6–20)
CO2: 23 mmol/L (ref 22–32)
Calcium: 9.4 mg/dL (ref 8.9–10.3)
Chloride: 109 mmol/L (ref 98–111)
Creatinine: 1.05 mg/dL — ABNORMAL HIGH (ref 0.44–1.00)
GFR, Estimated: >60 mL/min (ref >60)
Glucose, Bld: 139 mg/dL — ABNORMAL HIGH (ref 70–99)
Potassium: 4 mmol/L (ref 3.5–5.1)
Sodium: 141 mmol/L (ref 135–145)
Total Bilirubin: 0.4 mg/dL (ref 0.3–1.2)
Total Protein: 8 g/dL (ref 6.5–8.1)

## 2022-03-25 LAB — CBC WITH DIFFERENTIAL (CANCER CENTER ONLY)
Abs Immature Granulocytes: 0.01 10*3/uL (ref 0.00–0.07)
Basophils Absolute: 0.1 10*3/uL (ref 0.0–0.1)
Basophils Relative: 2 %
Eosinophils Absolute: 0.1 10*3/uL (ref 0.0–0.5)
Eosinophils Relative: 1 %
HCT: 39.3 % (ref 36.0–46.0)
Hemoglobin: 12.2 g/dL (ref 12.0–15.0)
Immature Granulocytes: 0 %
Lymphocytes Relative: 40 %
Lymphs Abs: 2.2 10*3/uL (ref 0.7–4.0)
MCH: 23.8 pg — ABNORMAL LOW (ref 26.0–34.0)
MCHC: 31 g/dL (ref 30.0–36.0)
MCV: 76.8 fL — ABNORMAL LOW (ref 80.0–100.0)
Monocytes Absolute: 0.4 10*3/uL (ref 0.1–1.0)
Monocytes Relative: 8 %
Neutro Abs: 2.8 10*3/uL (ref 1.7–7.7)
Neutrophils Relative %: 49 %
Platelet Count: 481 10*3/uL — ABNORMAL HIGH (ref 150–400)
RBC: 5.12 MIL/uL — ABNORMAL HIGH (ref 3.87–5.11)
RDW: 18.9 % — ABNORMAL HIGH (ref 11.5–15.5)
WBC Count: 5.6 10*3/uL (ref 4.0–10.5)
nRBC: 0 % (ref 0.0–0.2)

## 2022-03-25 LAB — FERRITIN: Ferritin: 17 ng/mL (ref 11–307)

## 2022-03-25 MED ORDER — CAPECITABINE 500 MG PO TABS
1500.0000 mg | ORAL_TABLET | Freq: Two times a day (BID) | ORAL | 0 refills | Status: DC
  Filled 2022-03-25: qty 84, 14d supply, fill #0

## 2022-03-28 ENCOUNTER — Telehealth: Payer: Self-pay | Admitting: Hematology

## 2022-03-28 NOTE — Telephone Encounter (Signed)
Contacted patient to scheduled appointments. Patient is aware of appointments that are scheduled.   

## 2022-03-29 ENCOUNTER — Other Ambulatory Visit (HOSPITAL_COMMUNITY): Payer: Self-pay

## 2022-03-29 ENCOUNTER — Other Ambulatory Visit: Payer: Self-pay | Admitting: Pharmacist

## 2022-03-29 ENCOUNTER — Other Ambulatory Visit: Payer: Self-pay

## 2022-03-29 DIAGNOSIS — C19 Malignant neoplasm of rectosigmoid junction: Secondary | ICD-10-CM

## 2022-03-29 MED ORDER — CAPECITABINE 500 MG PO TABS
1500.0000 mg | ORAL_TABLET | Freq: Two times a day (BID) | ORAL | 1 refills | Status: DC
  Filled 2022-03-29: qty 84, 21d supply, fill #0
  Filled 2022-04-15 (×2): qty 84, 21d supply, fill #1

## 2022-03-29 MED ORDER — CAPECITABINE 500 MG PO TABS
1500.0000 mg | ORAL_TABLET | Freq: Two times a day (BID) | ORAL | 1 refills | Status: DC
  Filled 2022-03-29: qty 84, 21d supply, fill #0

## 2022-03-29 NOTE — Progress Notes (Signed)
Oral Oncology Pharmacist Encounter  Reached out by pharmacy due to duplicate sig on patient's capecitabine Rx - wording updated to read as intended.   Leron Croak, PharmD, BCPS, BCOP Hematology/Oncology Clinical Pharmacist [facility] [place] [phone] 03/29/2022 11:11 AM

## 2022-03-30 ENCOUNTER — Other Ambulatory Visit (HOSPITAL_COMMUNITY): Payer: Self-pay

## 2022-04-05 ENCOUNTER — Other Ambulatory Visit (HOSPITAL_COMMUNITY): Payer: Self-pay

## 2022-04-06 ENCOUNTER — Other Ambulatory Visit (HOSPITAL_COMMUNITY): Payer: Self-pay

## 2022-04-12 ENCOUNTER — Other Ambulatory Visit (HOSPITAL_COMMUNITY): Payer: Self-pay

## 2022-04-12 ENCOUNTER — Encounter (HOSPITAL_COMMUNITY): Payer: Self-pay

## 2022-04-12 ENCOUNTER — Emergency Department (HOSPITAL_COMMUNITY)
Admission: EM | Admit: 2022-04-12 | Discharge: 2022-04-12 | Disposition: A | Discharge status: Home / Self Care | Payer: Medicaid Other | Attending: Emergency Medicine | Admitting: Emergency Medicine

## 2022-04-12 ENCOUNTER — Other Ambulatory Visit: Payer: Self-pay

## 2022-04-12 DIAGNOSIS — N390 Urinary tract infection, site not specified: Secondary | ICD-10-CM

## 2022-04-12 DIAGNOSIS — R300 Dysuria: Secondary | ICD-10-CM | POA: Diagnosis present

## 2022-04-12 HISTORY — DX: Type 2 diabetes mellitus without complications: E11.9

## 2022-04-12 HISTORY — DX: Hyperlipidemia, unspecified: E78.5

## 2022-04-12 HISTORY — DX: Malignant neoplasm of rectosigmoid junction: C19

## 2022-04-12 LAB — URINALYSIS, ROUTINE W REFLEX MICROSCOPIC
Bilirubin Urine: NEGATIVE
Glucose, UA: >=500 mg/dL — AB
Hgb urine dipstick: NEGATIVE
Ketones, ur: NEGATIVE mg/dL
Nitrite: NEGATIVE
Protein, ur: NEGATIVE mg/dL
Specific Gravity, Urine: 1.017 (ref 1.005–1.030)
pH: 5 (ref 5.0–8.0)

## 2022-04-12 LAB — BASIC METABOLIC PANEL
Anion gap: 10 (ref 5–15)
BUN: 30 mg/dL — ABNORMAL HIGH (ref 6–20)
CO2: 24 mmol/L (ref 22–32)
Calcium: 9.8 mg/dL (ref 8.9–10.3)
Chloride: 102 mmol/L (ref 98–111)
Creatinine, Ser: 1.17 mg/dL — ABNORMAL HIGH (ref 0.44–1.00)
GFR, Estimated: 58 mL/min — ABNORMAL LOW (ref >60)
Glucose, Bld: 205 mg/dL — ABNORMAL HIGH (ref 70–99)
Potassium: 4 mmol/L (ref 3.5–5.1)
Sodium: 136 mmol/L (ref 135–145)

## 2022-04-12 LAB — CBC
HCT: 40.7 % (ref 36.0–46.0)
Hemoglobin: 12.1 g/dL (ref 12.0–15.0)
MCH: 23.8 pg — ABNORMAL LOW (ref 26.0–34.0)
MCHC: 29.7 g/dL — ABNORMAL LOW (ref 30.0–36.0)
MCV: 80.1 fL (ref 80.0–100.0)
Platelets: 456 10*3/uL — ABNORMAL HIGH (ref 150–400)
RBC: 5.08 MIL/uL (ref 3.87–5.11)
RDW: 19.9 % — ABNORMAL HIGH (ref 11.5–15.5)
WBC: 6 10*3/uL (ref 4.0–10.5)
nRBC: 0 % (ref 0.0–0.2)

## 2022-04-12 MED ORDER — CEPHALEXIN 500 MG PO CAPS: 500.0000 mg | ORAL_CAPSULE | Freq: Four times a day (QID) | ORAL | 0 refills | Status: DC

## 2022-04-12 NOTE — Discharge Instructions (Addendum)
Return for any problem.   Follow up with your regular care provider in 1-2 days as instructed.

## 2022-04-12 NOTE — ED Triage Notes (Signed)
C/o left flank pain x3 days with burning with urination.  Also c/o pain to bilateral calfs described as stabbing, chills, and headache x3 days.  Hx stage 3 colorectal cancer and on oral chemo.

## 2022-04-12 NOTE — ED Provider Notes (Signed)
[place] EMERGENCY DEPARTMENT AT [facility] Provider Note   CSN: [id number] Arrival date & time: 04/12/22  0740     History  Chief Complaint  Patient presents with   Flank Pain    [name] is a 47 y.o. female.  47 year old female with prior medical history as detailed below presents for evaluation.  Patient complains of increased urinary frequency, mild dysuria, mild low back discomfort.  Symptoms began approximately 2 to 3 days ago.  Patient without systemic symptoms such as fever, chills, nausea or vomiting.  Patient is well-appearing.  No recent UTI or use of antibiotics reported.  Patient offered language interpreter.  Patient's daughter prefers to interpret for her mother.  The history is provided by the patient and medical records. A language interpreter was used.       Home Medications Prior to Admission medications   Medication Sig Start Date End Date Taking? Authorizing Provider  cephALEXin (KEFLEX) 500 MG capsule Take 1 capsule (500 mg total) by mouth 4 (four) times daily. 04/12/22  Yes Valarie Merino, MD  capecitabine (XELODA) 500 MG tablet Take 3 tablets (1,500 mg total) by mouth 2 (two) times daily after a meal. Take within 30 minutes after meals. Take for 14 days, then off for 7 days. Repeat every 21 days. 03/29/22   Truitt Merle, MD  ondansetron (ZOFRAN) 8 MG tablet Take 1 tablet (8 mg total) by mouth every 8 (eight) hours as needed for nausea or vomiting. Starting 3 days after chemotherapy 02/25/22   Heilingoetter, Cassandra L, PA-C  prochlorperazine (COMPAZINE) 10 MG tablet Take 1 tablet (10 mg total) by mouth every 6 (six) hours as needed. 02/25/22   Heilingoetter, Cassandra L, PA-C      Allergies    Patient has no known allergies.    Review of Systems   Review of Systems  All other systems reviewed and are negative.   Physical Exam Updated Vital Signs BP (!) 142/90 (BP Location: Left Arm)   Pulse 97   Temp 98.3 F (36.8 C)  (Oral)   Resp 16   SpO2 100%  Physical Exam Vitals and nursing note reviewed.  Constitutional:      General: She is not in acute distress.    Appearance: Normal appearance. She is well-developed.  HENT:     Head: Normocephalic and atraumatic.  Eyes:     Conjunctiva/sclera: Conjunctivae normal.     Pupils: Pupils are equal, round, and reactive to light.  Cardiovascular:     Rate and Rhythm: Normal rate and regular rhythm.     Heart sounds: Normal heart sounds.  Pulmonary:     Effort: Pulmonary effort is normal. No respiratory distress.     Breath sounds: Normal breath sounds.  Abdominal:     General: There is no distension.     Palpations: Abdomen is soft.     Tenderness: There is no abdominal tenderness.  Musculoskeletal:        General: No deformity. Normal range of motion.     Cervical back: Normal range of motion and neck supple.  Skin:    General: Skin is warm and dry.  Neurological:     General: No focal deficit present.     Mental Status: She is alert and oriented to person, place, and time.     ED Results / Procedures / Treatments   Labs (all labs ordered are listed, but only abnormal results are displayed) Labs Reviewed  URINALYSIS, ROUTINE W REFLEX MICROSCOPIC -  Abnormal; Notable for the following components:      Result Value   APPearance HAZY (*)    Glucose, UA >=500 (*)    Leukocytes,Ua TRACE (*)    Bacteria, UA MANY (*)    All other components within normal limits  CBC - Abnormal; Notable for the following components:   MCH 23.8 (*)    MCHC 29.7 (*)    RDW 19.9 (*)    Platelets 456 (*)    All other components within normal limits  BASIC METABOLIC PANEL - Abnormal; Notable for the following components:   Glucose, Bld 205 (*)    BUN 30 (*)    Creatinine, Ser 1.17 (*)    GFR, Estimated 58 (*)    All other components within normal limits    EKG None  Radiology No results found.  Procedures Procedures    Medications Ordered in  ED Medications - No data to display  ED Course/ Medical Decision Making/ A&P                             Medical Decision Making Amount and/or Complexity of Data Reviewed Labs: ordered.  Risk Prescription drug management.    Medical Screen Complete  This patient presented to the ED with complaint of dysuria, increased urinary frequency.  This complaint involves an extensive number of treatment options. The initial differential diagnosis includes, but is not limited to, UTI  This presentation is: Acute, Self-Limited, Previously Undiagnosed, Uncertain Prognosis, and Complicated  47 year old female is presenting with symptoms consistent with simple UTI.  Labs obtained are consistent with UTI.  Patient is agreeable with plan for outpatient management of symptoms.  Prescription provided for antibiotics.  Importance of close follow-up is stressed.  Strict return precautions given and understood.    Additional history obtained:  External records from outside sources obtained and reviewed including prior ED visits and prior Inpatient records.    Lab Tests:  I ordered and personally interpreted labs.  The pertinent results include: CBC, BMP, UA Problem List / ED Course:  UTI   Reevaluation:  After the interventions noted above, I reevaluated the patient and found that they have: improved  Disposition:  After consideration of the diagnostic results and the patients response to treatment, I feel that the patent would benefit from close outpatient follow-up.          Final Clinical Impression(s) / ED Diagnoses Final diagnoses:  Urinary tract infection without hematuria, site unspecified    Rx / DC Orders ED Discharge Orders          Ordered    cephALEXin (KEFLEX) 500 MG capsule  4 times daily        04/12/22 0942              Valarie Merino, MD 04/12/22 [phone]

## 2022-04-12 NOTE — ED Notes (Signed)
Discharge instructions reviewed, no questions at this time.

## 2022-04-15 ENCOUNTER — Encounter: Payer: Self-pay | Admitting: Hematology

## 2022-04-15 ENCOUNTER — Other Ambulatory Visit: Payer: Self-pay

## 2022-04-15 ENCOUNTER — Ambulatory Visit: Payer: Self-pay

## 2022-04-15 ENCOUNTER — Ambulatory Visit: Payer: Self-pay | Admitting: Hematology

## 2022-04-15 ENCOUNTER — Inpatient Hospital Stay (HOSPITAL_BASED_OUTPATIENT_CLINIC_OR_DEPARTMENT_OTHER): Payer: Medicaid Other | Admitting: Hematology

## 2022-04-15 ENCOUNTER — Inpatient Hospital Stay: Payer: Medicaid Other | Attending: Physician Assistant

## 2022-04-15 ENCOUNTER — Other Ambulatory Visit (HOSPITAL_COMMUNITY): Payer: Self-pay

## 2022-04-15 VITALS — BP 107/82 | HR 96 | Temp 98.7°F | Resp 18 | Ht 65.0 in | Wt 204.0 lb

## 2022-04-15 DIAGNOSIS — D500 Iron deficiency anemia secondary to blood loss (chronic): Secondary | ICD-10-CM

## 2022-04-15 DIAGNOSIS — Z5111 Encounter for antineoplastic chemotherapy: Secondary | ICD-10-CM | POA: Diagnosis present

## 2022-04-15 DIAGNOSIS — C187 Malignant neoplasm of sigmoid colon: Secondary | ICD-10-CM | POA: Diagnosis present

## 2022-04-15 DIAGNOSIS — E785 Hyperlipidemia, unspecified: Secondary | ICD-10-CM | POA: Diagnosis not present

## 2022-04-15 DIAGNOSIS — E119 Type 2 diabetes mellitus without complications: Secondary | ICD-10-CM | POA: Insufficient documentation

## 2022-04-15 DIAGNOSIS — Z9221 Personal history of antineoplastic chemotherapy: Secondary | ICD-10-CM | POA: Diagnosis not present

## 2022-04-15 DIAGNOSIS — C19 Malignant neoplasm of rectosigmoid junction: Secondary | ICD-10-CM | POA: Diagnosis not present

## 2022-04-15 LAB — CBC WITH DIFFERENTIAL (CANCER CENTER ONLY)
Abs Immature Granulocytes: 0.01 10*3/uL (ref 0.00–0.07)
Basophils Absolute: 0.1 10*3/uL (ref 0.0–0.1)
Basophils Relative: 1 %
Eosinophils Absolute: 0.1 10*3/uL (ref 0.0–0.5)
Eosinophils Relative: 1 %
HCT: 38.9 % (ref 36.0–46.0)
Hemoglobin: 12.2 g/dL (ref 12.0–15.0)
Immature Granulocytes: 0 %
Lymphocytes Relative: 42 %
Lymphs Abs: 2.2 10*3/uL (ref 0.7–4.0)
MCH: 24.3 pg — ABNORMAL LOW (ref 26.0–34.0)
MCHC: 31.4 g/dL (ref 30.0–36.0)
MCV: 77.3 fL — ABNORMAL LOW (ref 80.0–100.0)
Monocytes Absolute: 0.3 10*3/uL (ref 0.1–1.0)
Monocytes Relative: 6 %
Neutro Abs: 2.6 10*3/uL (ref 1.7–7.7)
Neutrophils Relative %: 50 %
Platelet Count: 523 10*3/uL — ABNORMAL HIGH (ref 150–400)
RBC: 5.03 MIL/uL (ref 3.87–5.11)
RDW: 19.4 % — ABNORMAL HIGH (ref 11.5–15.5)
WBC Count: 5.2 10*3/uL (ref 4.0–10.5)
nRBC: 0 % (ref 0.0–0.2)

## 2022-04-15 LAB — CMP (CANCER CENTER ONLY)
ALT: 9 U/L (ref 0–44)
AST: 14 U/L — ABNORMAL LOW (ref 15–41)
Albumin: 4.1 g/dL (ref 3.5–5.0)
Alkaline Phosphatase: 73 U/L (ref 38–126)
Anion gap: 10 (ref 5–15)
BUN: 26 mg/dL — ABNORMAL HIGH (ref 6–20)
CO2: 23 mmol/L (ref 22–32)
Calcium: 10.2 mg/dL (ref 8.9–10.3)
Chloride: 107 mmol/L (ref 98–111)
Creatinine: 1.08 mg/dL — ABNORMAL HIGH (ref 0.44–1.00)
GFR, Estimated: >60 mL/min (ref >60)
Glucose, Bld: 184 mg/dL — ABNORMAL HIGH (ref 70–99)
Potassium: 4.2 mmol/L (ref 3.5–5.1)
Sodium: 140 mmol/L (ref 135–145)
Total Bilirubin: 0.4 mg/dL (ref 0.3–1.2)
Total Protein: 8.3 g/dL — ABNORMAL HIGH (ref 6.5–8.1)

## 2022-04-15 LAB — FERRITIN: Ferritin: 13 ng/mL (ref 11–307)

## 2022-04-15 NOTE — Assessment & Plan Note (Signed)
-  Underwent evaluation in [place] for rectal bleeding which showed near obstructing sigmoid colon mass.  -CT CAP showed known carcinoma of the sigmoid colon, regional small lymph nodes and no other findings of distant metastatic disease.  -She underwent surgical resection on 01/05/22 that showed 5.5 x 5 x 1 cm mass, grade 2, adenocarcinoma. 1 lymph node of 23 lymph nodes examined was positive for carcinoma. This was staged at T3, N1a, M0. MMR normal  -I recommend adjuvant chemotherapy CapeOx, she started on 03/04/22 -She had recurrent nausea, no appetite after this for cycle chemotherapy, improved last week.  However she declines further more chemo.  After lengthy discussion about the benefit and potential side effect management, including dose adjustment, she agreed with oral chemo capecitabine alone.  Will reduce capecitabine to 1500 mg twice daily for day 1-14 every 21 days for cycle 2, which she will started 03/28/2022 -She was evaluated in ED for UTI April 12, 2022, she has recovered well.

## 2022-04-15 NOTE — Progress Notes (Signed)
[place]   Telephone:(336) [phone] Fax:(336) [phone]   Clinic Follow up Note   Patient Care Team: Pcp, No as PCP - General Truitt Merle, MD as Consulting Physician (Oncology)  Date of Service:  04/15/2022  CHIEF COMPLAINT: f/u of Colorectal cancer   CURRENT THERAPY:  Xeloda 3 tablet 2 times daily. 14 days on and 7 days off, Start Xeloda 03/28/2022   ASSESSMENT:  [name] is a 47 y.o. female with   Colorectal cancer ([place]) -Underwent evaluation in [place] for rectal bleeding which showed near obstructing sigmoid colon mass.  -CT CAP showed known carcinoma of the sigmoid colon, regional small lymph nodes and no other findings of distant metastatic disease.  -She underwent surgical resection on 01/05/22 that showed 5.5 x 5 x 1 cm mass, grade 2, adenocarcinoma. 1 lymph node of 23 lymph nodes examined was positive for carcinoma. This was staged at T3, N1a, M0. MMR normal  -I recommend adjuvant chemotherapy CapeOx, she started on 03/04/22 -She had recurrent nausea, no appetite after this for cycle chemotherapy, improved last week.  However she declines further more chemo.  After lengthy discussion about the benefit and potential side effect management, including dose adjustment, she agreed with oral chemo capecitabine alone.  Will reduce capecitabine to 1500 mg twice daily for day 1-14 every 21 days, which she started 03/28/2022, plan for total of 7 cycles if she tolerates well. -She was evaluated in ED for UTI April 12, 2022, she is still on attics, and her left flank pain has improved. -She tolerated first cycle of Xeloda alone well, with mild hand-foot syndrome and fatigue.  I encouraged her to consider dose increasing to 3 tab am and 4 tab PM, she declined       PLAN: -lab reviewed. -pt denied increase dose on the Xeloda -Request Genetics to be schedule -lab and f/u in 3 weeks    SUMMARY OF ONCOLOGIC HISTORY: Oncology History Overview Note   Cancer Staging   Colorectal cancer [facility]) Staging form: Colon and Rectum, AJCC 8th Edition - Clinical stage from 02/25/2022: Stage IIIB (cT3, cN1a, cM0) - Signed by Heilingoetter, Cassandra L, PA-C on 02/25/2022 Total positive nodes: 1 Histologic grade (G): G2 Histologic grading system: 4 grade system     Colorectal cancer ([place])  12/12/2021 Procedure   Repeat colonoscopy: -Distal rectal exam is normal and no palpable  -Sigmoid colon: 2 proliferative circumferential almost obstructing lesion.  Adult colonoscope could not pass through this lesion (biopsies taken).  Lesion is at 18 cm from the anal verge -Rigid sigmoidoscope the lesion is 18 cm from the anal verge.  Endoscopic diagnosis: Sigmoid mass almost obstructs the lumen suggestive of malignancy.    12/24/2021 Imaging   CT chest abdomen and pelvis  Conclusion: -1.  Known case of carcinoma of the sigmoid colon.  Segment of the sigmoid colon mural wall thickening with regional small lymph nodes. 2.  No suspicious distant metastatic lesion 3.  There is slight heterogeneous enhancement of the pancreatic head/uncanny process with some fatty changes and fat stranding likely postinflammatory changes, focal pancreatitis/groove pancreatitis   01/05/2022 Pathology Results   Operation: Laparoscopic anterior resection -Tumor size 5.5 x 5 x 1 cm -Histologic grade: G2, moderately differentiated -Tumor extent: Invades through the muscularis propria into pericolonic tissue -All margins are free of invasive carcinoma -Number of lymph nodes involved: 1 -Number of lymph nodes examined: 23  Pathologic staging: PT3 N1a      02/25/2022 Initial Diagnosis   Colorectal cancer (  [place])   02/25/2022 Cancer Staging   Staging form: Colon and Rectum, AJCC 8th Edition - Clinical stage from 02/25/2022: Stage IIIB (cT3, cN1a, cM0) - Signed by Heilingoetter, Cassandra L, PA-C on 02/25/2022 Total positive nodes: 1 Histologic grade (G): G2 Histologic grading system: 4 grade  system   03/04/2022 - 03/04/2022 Chemotherapy   Patient is on Treatment Plan : COLORECTAL Xelox (Capeox)(130/850) q21d        INTERVAL HISTORY:  [name] is here for a follow up of Colorectal cancer  She was last seen by me on 03/25/2022 She presents to the clinic accompanied by daughter. Pt reports that she is doing well with the pills, but she has dry hands and no peeling and the feet but she has some itching. Pt finish last pill Sunday and she start next Monday with the pill. Pt has a back pain for five days  and had been diagnose with having a UTI. Pt deny having burning sensation when voiding.     All other systems were reviewed with the patient and are negative.  MEDICAL HISTORY:  Past Medical History:  Diagnosis Date   Colorectal cancer ([place])    Diabetes mellitus without complication ([place])    Hyperlipemia     SURGICAL HISTORY: Past Surgical History:  Procedure Laterality Date   COLON SURGERY      I have reviewed the social history and family history with the patient and they are unchanged from previous note.  ALLERGIES:  has No Known Allergies.  MEDICATIONS:  Current Outpatient Medications  Medication Sig Dispense Refill   capecitabine (XELODA) 500 MG tablet Take 3 tablets (1,500 mg total) by mouth 2 (two) times daily after a meal. Take within 30 minutes after meals. Take for 14 days, then off for 7 days. Repeat every 21 days. 84 tablet 1   cephALEXin (KEFLEX) 500 MG capsule Take 1 capsule (500 mg total) by mouth 4 (four) times daily. 28 capsule 0   ondansetron (ZOFRAN) 8 MG tablet Take 1 tablet (8 mg total) by mouth every 8 (eight) hours as needed for nausea or vomiting. Starting 3 days after chemotherapy 30 tablet 2   prochlorperazine (COMPAZINE) 10 MG tablet Take 1 tablet (10 mg total) by mouth every 6 (six) hours as needed. 30 tablet 2   No current facility-administered medications for this visit.    PHYSICAL EXAMINATION: ECOG PERFORMANCE STATUS: 1 -  Symptomatic but completely ambulatory  Vitals:   04/15/22 1032  BP: 107/82  Pulse: 96  Resp: 18  Temp: 98.7 F (37.1 C)  SpO2: 99%   Wt Readings from Last 3 Encounters:  04/15/22 204 lb (92.5 kg)  03/25/22 203 lb 11.2 oz (92.4 kg)  03/10/22 206 lb 11.2 oz (93.8 kg)     GENERAL:alert, no distress and comfortable SKIN: skin color normal, no rashes or significant lesions EYES: normal, Conjunctiva are pink and non-injected, sclera clear  NEURO: alert & oriented x 3 with fluent speech   LABORATORY DATA:  I have reviewed the data as listed    Latest Ref Rng & Units 04/15/2022    9:48 AM 04/12/2022    8:49 AM 03/25/2022    8:01 AM  CBC  WBC 4.0 - 10.5 K/uL 5.2  6.0  5.6   Hemoglobin 12.0 - 15.0 g/dL 12.2  12.1  12.2   Hematocrit 36.0 - 46.0 % 38.9  40.7  39.3   Platelets 150 - 400 K/uL 523  456  481  Latest Ref Rng & Units 04/15/2022    9:48 AM 04/12/2022    8:49 AM 03/25/2022    8:01 AM  CMP  Glucose 70 - 99 mg/dL 184  205  139   BUN 6 - 20 mg/dL 26  30  20    Creatinine 0.44 - 1.00 mg/dL 1.08  1.17  1.05   Sodium 135 - 145 mmol/L 140  136  141   Potassium 3.5 - 5.1 mmol/L 4.2  4.0  4.0   Chloride 98 - 111 mmol/L 107  102  109   CO2 22 - 32 mmol/L 23  24  23    Calcium 8.9 - 10.3 mg/dL 10.2  9.8  9.4   Total Protein 6.5 - 8.1 g/dL 8.3   8.0   Total Bilirubin 0.3 - 1.2 mg/dL 0.4   0.4   Alkaline Phos 38 - 126 U/L 73   67   AST 15 - 41 U/L 14   22   ALT 0 - 44 U/L 9   21       RADIOGRAPHIC STUDIES: I have personally reviewed the radiological images as listed and agreed with the findings in the report. No results found.    No orders of the defined types were placed in this encounter.  All questions were answered. The patient knows to call the clinic with any problems, questions or concerns. No barriers to learning was detected. The total time spent in the appointment was 25 minutes.     Truitt Merle, MD 04/15/2022   Felicity Coyer, CMA, am acting as  scribe for Truitt Merle, MD.   I have reviewed the above documentation for accuracy and completeness, and I agree with the above.

## 2022-04-25 ENCOUNTER — Encounter: Payer: Self-pay | Admitting: Genetic Counselor

## 2022-04-25 ENCOUNTER — Other Ambulatory Visit: Payer: Self-pay

## 2022-04-28 ENCOUNTER — Other Ambulatory Visit: Payer: Self-pay | Admitting: Hematology

## 2022-04-28 ENCOUNTER — Other Ambulatory Visit (HOSPITAL_COMMUNITY): Payer: Self-pay

## 2022-04-28 ENCOUNTER — Other Ambulatory Visit: Payer: Self-pay

## 2022-04-28 DIAGNOSIS — C19 Malignant neoplasm of rectosigmoid junction: Secondary | ICD-10-CM

## 2022-04-28 MED ORDER — CAPECITABINE 500 MG PO TABS
1500.0000 mg | ORAL_TABLET | Freq: Two times a day (BID) | ORAL | 1 refills | Status: DC
  Filled 2022-04-28: qty 84, 21d supply, fill #0
  Filled 2022-05-25: qty 84, 21d supply, fill #1

## 2022-05-02 ENCOUNTER — Other Ambulatory Visit: Payer: Self-pay

## 2022-05-04 NOTE — Progress Notes (Addendum)
[facility] OFFICE PROGRESS NOTE  Pcp, No No address on file  DIAGNOSIS: f/u of Colorectal cancer   Oncology History Overview Note   Cancer Staging  Colorectal cancer [facility]) Staging form: Colon and Rectum, AJCC 8th Edition - Clinical stage from 02/25/2022: Stage IIIB (cT3, cN1a, cM0) - Signed by Roper Tolson L, PA-C on 02/25/2022 Total positive nodes: 1 Histologic grade (G): G2 Histologic grading system: 4 grade system     Colorectal cancer  12/12/2021 Procedure   Repeat colonoscopy: -Distal rectal exam is normal and no palpable  -Sigmoid colon: 2 proliferative circumferential almost obstructing lesion.  Adult colonoscope could not pass through this lesion (biopsies taken).  Lesion is at 18 cm from the anal verge -Rigid sigmoidoscope the lesion is 18 cm from the anal verge.  Endoscopic diagnosis: Sigmoid mass almost obstructs the lumen suggestive of malignancy.    12/24/2021 Imaging   CT chest abdomen and pelvis  Conclusion: -1.  Known case of carcinoma of the sigmoid colon.  Segment of the sigmoid colon mural wall thickening with regional small lymph nodes. 2.  No suspicious distant metastatic lesion 3.  There is slight heterogeneous enhancement of the pancreatic head/uncanny process with some fatty changes and fat stranding likely postinflammatory changes, focal pancreatitis/groove pancreatitis   01/05/2022 Pathology Results   Operation: Laparoscopic anterior resection -Tumor size 5.5 x 5 x 1 cm -Histologic grade: G2, moderately differentiated -Tumor extent: Invades through the muscularis propria into pericolonic tissue -All margins are free of invasive carcinoma -Number of lymph nodes involved: 1 -Number of lymph nodes examined: 23  Pathologic staging: PT3 N1a      02/25/2022 Initial Diagnosis   Colorectal cancer (HCC)   02/25/2022 Cancer Staging   Staging form: Colon and Rectum, AJCC 8th Edition - Clinical stage from 02/25/2022: Stage IIIB  (cT3, cN1a, cM0) - Signed by Dreshon Proffit L, PA-C on 02/25/2022 Total positive nodes: 1 Histologic grade (G): G2 Histologic grading system: 4 grade system   03/04/2022 - 03/04/2022 Chemotherapy   Patient is on Treatment Plan : COLORECTAL Xelox (Capeox)(130/850) q21d       CURRENT THERAPY: Xeloda 3 tablets (1500 mg) 2 times daily 14 days on and 7 days off. She discontinued her oxaliplatin after cycle #1.   INTERVAL HISTORY: [name] 47 y.o. female returns to the clinic today for a follow-up visit accompanied by her daughter.  The patient was diagnosed with colorectal cancer in the November/December 2023.  She is currently undergoing adjuvant treatment.  She underwent 1 cycle of CAPOX but she requested to discontinue her oxaliplatin after cycle #1.  She is currently on Xeloda alone.  Dr. Mosetta Putt had encouraged her to take 1500 mg in the a.m. and 2000 mg in the p.m. but the patient declined.  She is presently taking 1500 mg twice daily on days 1 through 14.  She tolerates this well except for fatigue and mild hand-foot syndrome (mild pain and dry skin) for which she uses cream.   The patients only concern today is that she went to the emergency room on 04/12/2022 and was treated for UTI.  She continues to have similar symptoms with dysuria.  Denies any itching.  Her urine appeared clear today.  She is using Azo and cranberry juice and drinking plenty of fluids without any significant relief.  She is not sure if this is coming from the urethra versus vaginal area.  Denies any vaginal lesions.  Her UA from the ER was negative for nitrites or  white blood cells.  I had trace amounts of leukocytes and many bacteria.  The patient denies any complaints today.  Denies any fever, chills, night sweats.  Denies any appetite change.  Denies any abdominal pain except for some mild pain over the surgery site if she bends over, overall, this is improving.  Denies any nausea, vomiting, diarrhea, or  constipation.  Denies any blood in the stool.  Denies any jaundice or itching.  Denies any shortness of breath, cough, or hemoptysis.  She is here today for evaluation and repeat blood work.   MEDICAL HISTORY: Past Medical History:  Diagnosis Date   Colorectal cancer (HCC)    Diabetes mellitus without complication (HCC)    Hyperlipemia     ALLERGIES:  has No Known Allergies.  MEDICATIONS:  Current Outpatient Medications  Medication Sig Dispense Refill   fluconazole (DIFLUCAN) 150 MG tablet Take 1 tablet, if persistent symptoms after 72 hours, may take second tablet 2 tablet 0   capecitabine (XELODA) 500 MG tablet Take 3 tablets (1,500 mg total) by mouth 2 (two) times daily after a meal. Take within 30 minutes after meals. Take for 14 days, then off for 7 days. Repeat every 21 days. 84 tablet 1   cephALEXin (KEFLEX) 500 MG capsule Take 1 capsule (500 mg total) by mouth 4 (four) times daily. 28 capsule 0   ondansetron (ZOFRAN) 8 MG tablet Take 1 tablet (8 mg total) by mouth every 8 (eight) hours as needed for nausea or vomiting. Starting 3 days after chemotherapy 30 tablet 2   prochlorperazine (COMPAZINE) 10 MG tablet Take 1 tablet (10 mg total) by mouth every 6 (six) hours as needed. 30 tablet 2   No current facility-administered medications for this visit.    SURGICAL HISTORY:  Past Surgical History:  Procedure Laterality Date   COLON SURGERY      REVIEW OF SYSTEMS:   Review of Systems  Constitutional: Negative for appetite change, chills, fatigue, fever and unexpected weight change.  HENT: Negative for mouth sores, nosebleeds, sore throat and trouble swallowing.   Eyes: Negative for eye problems and icterus.  Respiratory: Negative for cough, hemoptysis, shortness of breath and wheezing.   Cardiovascular: Negative for chest pain and leg swelling.  Gastrointestinal: Negative for abdominal pain, constipation, diarrhea, nausea and vomiting.  Genitourinary: Positive for dysuria.  Negative for bladder incontinence, difficulty urinating, frequency and hematuria.   Musculoskeletal: Negative for back pain, gait problem, neck pain and neck stiffness.  Skin: Negative for itching and rash.  Neurological: Negative for dizziness, extremity weakness, gait problem, headaches, light-headedness and seizures.  Hematological: Negative for adenopathy. Does not bruise/bleed easily.  Psychiatric/Behavioral: Negative for confusion, depression and sleep disturbance. The patient is not nervous/anxious.     PHYSICAL EXAMINATION:  Blood pressure 111/76, pulse 89, temperature (!) 97.3 F (36.3 C), resp. rate 20, weight 205 lb 8 oz (93.2 kg), SpO2 100 %.  ECOG PERFORMANCE STATUS: 1  Physical Exam  Constitutional: Oriented to person, place, and time and well-developed, well-nourished, and in no distress.  HENT:  Head: Normocephalic and atraumatic.  Mouth/Throat: Oropharynx is clear and moist. No oropharyngeal exudate.  Eyes: Conjunctivae are normal. Right eye exhibits no discharge. Left eye exhibits no discharge. No scleral icterus.  Neck: Normal range of motion. Neck supple.  Cardiovascular: Normal rate, regular rhythm, normal heart sounds and intact distal pulses.   Pulmonary/Chest: Effort normal and breath sounds normal. No respiratory distress. No wheezes. No rales.  Abdominal: Soft. Bowel sounds are normal.  Exhibits no distension and no mass. There is no tenderness.  GYN: Chaperone present Passenger transport manager), no obvious vaginal lesion.  Some irritation in the vulvar area.  Musculoskeletal: Normal range of motion. Exhibits no edema.  Lymphadenopathy:    No cervical adenopathy.  Neurological: Alert and oriented to person, place, and time. Exhibits normal muscle tone. Gait normal. Coordination normal.  Skin: Skin is warm and dry. No rash noted. Not diaphoretic. No erythema. No pallor.  Psychiatric: Mood, memory and judgment normal.  Vitals reviewed.  LABORATORY DATA: Lab Results  Component  Value Date   WBC 5.0 05/06/2022   HGB 12.2 05/06/2022   HCT 38.2 05/06/2022   MCV 77.5 (L) 05/06/2022   PLT 528 (H) 05/06/2022      Chemistry      Component Value Date/Time   NA 139 05/06/2022 0906   K 4.3 05/06/2022 0906   CL 106 05/06/2022 0906   CO2 25 05/06/2022 0906   BUN 27 (H) 05/06/2022 0906   CREATININE 1.12 (H) 05/06/2022 0906      Component Value Date/Time   CALCIUM 9.8 05/06/2022 0906   ALKPHOS 69 05/06/2022 0906   AST 16 05/06/2022 0906   ALT 13 05/06/2022 0906   BILITOT 0.6 05/06/2022 0906       RADIOGRAPHIC STUDIES:  No results found.   ASSESSMENT/PLAN:  [name] is a 47 y.o. female with    Colorectal cancer (HCC) (T3, N1a, M0). MMR normal. Diagnosed in December 2023 -Underwent evaluation in [place] for rectal bleeding which showed near obstructing sigmoid colon mass.  -CT CAP showed known carcinoma of the sigmoid colon, regional small lymph nodes and no other findings of distant metastatic disease.  -She underwent surgical resection on 01/05/22 that showed 5.5 x 5 x 1 cm mass, grade 2, adenocarcinoma. 1 lymph node of 23 lymph nodes examined was positive for carcinoma. This was staged at T3, N1a, M0. MMR normal  -Dr. Mosetta Putt recommended adjuvant chemotherapy CapeOx, she started on 03/04/22 -She had recurrent nausea, no appetite after this for cycle chemotherapy.  However she declined further more chemo.  After lengthy discussion about the benefit and potential side effect management, including dose adjustment, she agreed with oral chemo capecitabine alone.  Dose of capecitabine was reduced to 1500 mg twice daily for day 1-14 every 21 days, which she started 03/28/2022, plan for total of 7 cycles if she tolerates well. -She tolerated first cycle of Xeloda alone well, with mild hand-foot syndrome and fatigue. Dr. Mosetta Putt previously encouraged her to consider dose increasing to 3 tab am and 4 tab PM, she declined.  He continues to decline today 05/06/22 -Genetic  counseling scheduled for 06/06/22 -She is here for evaluation prior to starting cycle #4 today. Labs were reviewed. Recommend she continue with treatment at the same dose.  -She will follow up in 3 weeks with the next cycle of treatment      UTI/Yeast infection -Was treated for UTI in emergency room on 04/12/2022 UA showed many bacteria and trace leukocytes.  Negative for nitrites red blood cells, white blood cells -completed course of Keflex without any improvement -Performed gynecologic exam today which showed some irritation of the vulvar area which may be related to possible yeast.  I have sent a prescription for Diflucan to take 150 mg tablet.  She may take another 150 mg tablet after 72 hours if symptoms persist.  Also encouraged to hydrate well, use Azo, and cranberry juice -If symptoms persist, they can consider Monistat over-the-counter -  had repeat UA and culture today.  UA negative for nitrites, leukocytes, or white blood cell count.  There was many bacteria which could be contamination.  I will hold off on sending antibiotics for now this does not appear to be consistent UTI and urine was clear.  If the culture shows findings for UTI, then I will send in another antibiotic     PLAN: -lab reviewed. -pt previously denied increase dose on the Xeloda, will continue 1500 grams twice daily days 1 through 14 for now -lab and f/u in 3 weeks -Diflucan sent to pharmacy     Orders Placed This Encounter  Procedures   Urine Culture    Standing Status:   Future    Number of Occurrences:   1    Standing Expiration Date:   05/06/2023     The total time spent in the appointment was 20-29 minutes  Ayron Fillinger L Chassidy Layson, PA-C 05/06/22

## 2022-05-06 ENCOUNTER — Other Ambulatory Visit: Payer: Self-pay

## 2022-05-06 ENCOUNTER — Inpatient Hospital Stay (HOSPITAL_BASED_OUTPATIENT_CLINIC_OR_DEPARTMENT_OTHER): Payer: Medicaid Other | Admitting: Physician Assistant

## 2022-05-06 ENCOUNTER — Ambulatory Visit: Payer: Self-pay

## 2022-05-06 ENCOUNTER — Inpatient Hospital Stay: Payer: Medicaid Other | Attending: Physician Assistant

## 2022-05-06 ENCOUNTER — Ambulatory Visit: Payer: Self-pay | Admitting: Hematology

## 2022-05-06 VITALS — BP 111/76 | HR 89 | Temp 97.3°F | Resp 20 | Wt 205.5 lb

## 2022-05-06 DIAGNOSIS — R300 Dysuria: Secondary | ICD-10-CM

## 2022-05-06 DIAGNOSIS — B379 Candidiasis, unspecified: Secondary | ICD-10-CM | POA: Insufficient documentation

## 2022-05-06 DIAGNOSIS — N390 Urinary tract infection, site not specified: Secondary | ICD-10-CM

## 2022-05-06 DIAGNOSIS — C19 Malignant neoplasm of rectosigmoid junction: Secondary | ICD-10-CM | POA: Diagnosis not present

## 2022-05-06 DIAGNOSIS — E119 Type 2 diabetes mellitus without complications: Secondary | ICD-10-CM | POA: Diagnosis not present

## 2022-05-06 DIAGNOSIS — Z79899 Other long term (current) drug therapy: Secondary | ICD-10-CM | POA: Diagnosis not present

## 2022-05-06 DIAGNOSIS — E785 Hyperlipidemia, unspecified: Secondary | ICD-10-CM | POA: Insufficient documentation

## 2022-05-06 DIAGNOSIS — D500 Iron deficiency anemia secondary to blood loss (chronic): Secondary | ICD-10-CM

## 2022-05-06 LAB — CMP (CANCER CENTER ONLY)
ALT: 13 U/L (ref 0–44)
AST: 16 U/L (ref 15–41)
Albumin: 3.8 g/dL (ref 3.5–5.0)
Alkaline Phosphatase: 69 U/L (ref 38–126)
Anion gap: 8 (ref 5–15)
BUN: 27 mg/dL — ABNORMAL HIGH (ref 6–20)
CO2: 25 mmol/L (ref 22–32)
Calcium: 9.8 mg/dL (ref 8.9–10.3)
Chloride: 106 mmol/L (ref 98–111)
Creatinine: 1.12 mg/dL — ABNORMAL HIGH (ref 0.44–1.00)
GFR, Estimated: >60 mL/min (ref >60)
Glucose, Bld: 161 mg/dL — ABNORMAL HIGH (ref 70–99)
Potassium: 4.3 mmol/L (ref 3.5–5.1)
Sodium: 139 mmol/L (ref 135–145)
Total Bilirubin: 0.6 mg/dL (ref 0.3–1.2)
Total Protein: 8.4 g/dL — ABNORMAL HIGH (ref 6.5–8.1)

## 2022-05-06 LAB — CBC WITH DIFFERENTIAL (CANCER CENTER ONLY)
Abs Immature Granulocytes: 0.01 10*3/uL (ref 0.00–0.07)
Basophils Absolute: 0.1 10*3/uL (ref 0.0–0.1)
Basophils Relative: 1 %
Eosinophils Absolute: 0.1 10*3/uL (ref 0.0–0.5)
Eosinophils Relative: 1 %
HCT: 38.2 % (ref 36.0–46.0)
Hemoglobin: 12.2 g/dL (ref 12.0–15.0)
Immature Granulocytes: 0 %
Lymphocytes Relative: 44 %
Lymphs Abs: 2.2 10*3/uL (ref 0.7–4.0)
MCH: 24.7 pg — ABNORMAL LOW (ref 26.0–34.0)
MCHC: 31.9 g/dL (ref 30.0–36.0)
MCV: 77.5 fL — ABNORMAL LOW (ref 80.0–100.0)
Monocytes Absolute: 0.3 10*3/uL (ref 0.1–1.0)
Monocytes Relative: 5 %
Neutro Abs: 2.4 10*3/uL (ref 1.7–7.7)
Neutrophils Relative %: 49 %
Platelet Count: 528 10*3/uL — ABNORMAL HIGH (ref 150–400)
RBC: 4.93 MIL/uL (ref 3.87–5.11)
RDW: 19.5 % — ABNORMAL HIGH (ref 11.5–15.5)
WBC Count: 5 10*3/uL (ref 4.0–10.5)
nRBC: 0 % (ref 0.0–0.2)

## 2022-05-06 LAB — URINALYSIS, COMPLETE (UACMP) WITH MICROSCOPIC
Bilirubin Urine: NEGATIVE
Glucose, UA: >=500 mg/dL — AB
Hgb urine dipstick: NEGATIVE
Ketones, ur: NEGATIVE mg/dL
Leukocytes,Ua: NEGATIVE
Nitrite: NEGATIVE
Protein, ur: NEGATIVE mg/dL
Specific Gravity, Urine: 1.017 (ref 1.005–1.030)
pH: 5 (ref 5.0–8.0)

## 2022-05-06 LAB — FERRITIN: Ferritin: 13 ng/mL (ref 11–307)

## 2022-05-06 MED ORDER — FLUCONAZOLE 150 MG PO TABS: ORAL_TABLET | ORAL | 0 refills | Status: DC

## 2022-05-07 LAB — URINE CULTURE: Culture: <10000 — AB

## 2022-05-13 ENCOUNTER — Other Ambulatory Visit: Payer: Self-pay | Admitting: Nurse Practitioner

## 2022-05-13 DIAGNOSIS — Z1231 Encounter for screening mammogram for malignant neoplasm of breast: Secondary | ICD-10-CM

## 2022-05-16 ENCOUNTER — Ambulatory Visit
Admission: RE | Admit: 2022-05-16 | Discharge: 2022-05-16 | Disposition: A | Discharge status: Home / Self Care | Payer: Medicaid Other | Source: Ambulatory Visit | Attending: Nurse Practitioner | Admitting: Nurse Practitioner

## 2022-05-16 DIAGNOSIS — Z1231 Encounter for screening mammogram for malignant neoplasm of breast: Secondary | ICD-10-CM

## 2022-05-19 ENCOUNTER — Other Ambulatory Visit: Payer: Medicaid Other

## 2022-05-19 ENCOUNTER — Ambulatory Visit: Payer: Medicaid Other | Admitting: Internal Medicine

## 2022-05-19 ENCOUNTER — Other Ambulatory Visit (HOSPITAL_COMMUNITY): Payer: Self-pay

## 2022-05-23 ENCOUNTER — Other Ambulatory Visit: Payer: Self-pay

## 2022-05-25 ENCOUNTER — Other Ambulatory Visit: Payer: Self-pay

## 2022-05-25 ENCOUNTER — Other Ambulatory Visit (HOSPITAL_COMMUNITY): Payer: Self-pay

## 2022-05-27 ENCOUNTER — Other Ambulatory Visit: Payer: Self-pay

## 2022-05-27 ENCOUNTER — Other Ambulatory Visit (HOSPITAL_COMMUNITY): Payer: Self-pay

## 2022-05-27 ENCOUNTER — Inpatient Hospital Stay: Payer: Medicaid Other

## 2022-05-27 ENCOUNTER — Encounter: Payer: Self-pay | Admitting: Hematology

## 2022-05-27 ENCOUNTER — Inpatient Hospital Stay (HOSPITAL_BASED_OUTPATIENT_CLINIC_OR_DEPARTMENT_OTHER): Payer: Medicaid Other | Admitting: Hematology

## 2022-05-27 VITALS — BP 129/90 | HR 111 | Temp 98.3°F | Resp 16 | Ht 65.0 in | Wt 210.3 lb

## 2022-05-27 DIAGNOSIS — C19 Malignant neoplasm of rectosigmoid junction: Secondary | ICD-10-CM

## 2022-05-27 DIAGNOSIS — D500 Iron deficiency anemia secondary to blood loss (chronic): Secondary | ICD-10-CM

## 2022-05-27 LAB — CMP (CANCER CENTER ONLY)
ALT: 9 U/L (ref 0–44)
AST: 12 U/L — ABNORMAL LOW (ref 15–41)
Albumin: 4.1 g/dL (ref 3.5–5.0)
Alkaline Phosphatase: 77 U/L (ref 38–126)
Anion gap: 7 (ref 5–15)
BUN: 24 mg/dL — ABNORMAL HIGH (ref 6–20)
CO2: 27 mmol/L (ref 22–32)
Calcium: 10.3 mg/dL (ref 8.9–10.3)
Chloride: 104 mmol/L (ref 98–111)
Creatinine: 1.12 mg/dL — ABNORMAL HIGH (ref 0.44–1.00)
GFR, Estimated: >60 mL/min (ref >60)
Glucose, Bld: 170 mg/dL — ABNORMAL HIGH (ref 70–99)
Potassium: 4.5 mmol/L (ref 3.5–5.1)
Sodium: 138 mmol/L (ref 135–145)
Total Bilirubin: 0.3 mg/dL (ref 0.3–1.2)
Total Protein: 8 g/dL (ref 6.5–8.1)

## 2022-05-27 LAB — CBC WITH DIFFERENTIAL (CANCER CENTER ONLY)
Abs Immature Granulocytes: 0.03 10*3/uL (ref 0.00–0.07)
Basophils Absolute: 0.1 10*3/uL (ref 0.0–0.1)
Basophils Relative: 1 %
Eosinophils Absolute: 0.1 10*3/uL (ref 0.0–0.5)
Eosinophils Relative: 1 %
HCT: 39.1 % (ref 36.0–46.0)
Hemoglobin: 12.1 g/dL (ref 12.0–15.0)
Immature Granulocytes: 0 %
Lymphocytes Relative: 31 %
Lymphs Abs: 2.4 10*3/uL (ref 0.7–4.0)
MCH: 24.3 pg — ABNORMAL LOW (ref 26.0–34.0)
MCHC: 30.9 g/dL (ref 30.0–36.0)
MCV: 78.5 fL — ABNORMAL LOW (ref 80.0–100.0)
Monocytes Absolute: 0.4 10*3/uL (ref 0.1–1.0)
Monocytes Relative: 5 %
Neutro Abs: 4.9 10*3/uL (ref 1.7–7.7)
Neutrophils Relative %: 62 %
Platelet Count: 542 10*3/uL — ABNORMAL HIGH (ref 150–400)
RBC: 4.98 MIL/uL (ref 3.87–5.11)
RDW: 19.1 % — ABNORMAL HIGH (ref 11.5–15.5)
WBC Count: 7.8 10*3/uL (ref 4.0–10.5)
nRBC: 0 % (ref 0.0–0.2)

## 2022-05-27 LAB — FERRITIN: Ferritin: 11 ng/mL (ref 11–307)

## 2022-05-27 MED ORDER — CAPECITABINE 500 MG PO TABS
1500.0000 mg | ORAL_TABLET | Freq: Two times a day (BID) | ORAL | 2 refills | Status: AC
Start: 2022-05-27 — End: ?
  Filled 2022-05-27 – 2022-06-13 (×2): qty 84, 14d supply, fill #0
  Filled 2022-07-04: qty 84, 14d supply, fill #1
  Filled 2022-08-29: qty 84, 14d supply, fill #2

## 2022-05-27 NOTE — Progress Notes (Signed)
[facility]   Telephone:(336) [phone] [phone]   Clinic Follow up Note   Patient Care Team: Pcp, No as PCP - General Malachy Mood, MD as Consulting Physician (Oncology)  Date of Service:  05/27/2022  CHIEF COMPLAINT: f/u of Colorectal cancer   CURRENT THERAPY:   Xeloda 3 tablets (1500 mg) 2 times daily 14 days on and 7 days off. She discontinued her oxaliplatin after cycle #1.    ASSESSMENT:  [name] is a 47 y.o. female with   Colorectal cancer (HCC) -Sigmoid colon cancer, stage IIIb, pT3 N1a M0, MMR normal -Diagnosed in November 2023, status post surgical resection on January 05, 2022 --I recommend adjuvant chemotherapy CapeOx, she started on 03/04/22 , due to poor tolerance, she declined more IV chemo after his first cycle.  We switched to Xeloda from cycle 2, she is tolerating well overall. -Lab reviewed, adequate for treatment, will proceed cycle 5 next Monday -Plan for total of 8 cycles if she tolerates well.  Thrombocytosis -Likely reactive, probably related to iron deficiency -Overall stable, no concerns.  I encouraged her to take oral iron.  PLAN: -lab reviewed Platelet count little elevated -She was started cycle 5 Xeloda next Monday. -Encourage pt to increase hydration intake. -will order Ct scan after last cycle of Xeloda -lab and f/u in 3 weeks   SUMMARY OF ONCOLOGIC HISTORY: Oncology History Overview Note   Cancer Staging  Colorectal cancer [facility]) Staging form: Colon and Rectum, AJCC 8th Edition - Clinical stage from 02/25/2022: Stage IIIB (cT3, cN1a, cM0) - Signed by Heilingoetter, Cassandra L, PA-C on 02/25/2022 Total positive nodes: 1 Histologic grade (G): G2 Histologic grading system: 4 grade system     Colorectal cancer (HCC)  12/12/2021 Procedure   Repeat colonoscopy: -Distal rectal exam is normal and no palpable  -Sigmoid colon: 2 proliferative circumferential almost obstructing lesion.  Adult colonoscope could not  pass through this lesion (biopsies taken).  Lesion is at 18 cm from the anal verge -Rigid sigmoidoscope the lesion is 18 cm from the anal verge.  Endoscopic diagnosis: Sigmoid mass almost obstructs the lumen suggestive of malignancy.    12/24/2021 Imaging   CT chest abdomen and pelvis  Conclusion: -1.  Known case of carcinoma of the sigmoid colon.  Segment of the sigmoid colon mural wall thickening with regional small lymph nodes. 2.  No suspicious distant metastatic lesion 3.  There is slight heterogeneous enhancement of the pancreatic head/uncanny process with some fatty changes and fat stranding likely postinflammatory changes, focal pancreatitis/groove pancreatitis   01/05/2022 Pathology Results   Operation: Laparoscopic anterior resection -Tumor size 5.5 x 5 x 1 cm -Histologic grade: G2, moderately differentiated -Tumor extent: Invades through the muscularis propria into pericolonic tissue -All margins are free of invasive carcinoma -Number of lymph nodes involved: 1 -Number of lymph nodes examined: 23  Pathologic staging: PT3 N1a      02/25/2022 Initial Diagnosis   Colorectal cancer (HCC)   02/25/2022 Cancer Staging   Staging form: Colon and Rectum, AJCC 8th Edition - Clinical stage from 02/25/2022: Stage IIIB (cT3, cN1a, cM0) - Signed by Heilingoetter, Cassandra L, PA-C on 02/25/2022 Total positive nodes: 1 Histologic grade (G): G2 Histologic grading system: 4 grade system   03/04/2022 - 03/04/2022 Chemotherapy   Patient is on Treatment Plan : COLORECTAL Xelox (Capeox)(130/850) q21d        INTERVAL HISTORY:  [name] is here for a follow up of Colorectal cancer. She was last seen by  [facility]  PA-C  on 05/06/2022. She presents to the clinic accompanied by daughter. Pt state that she has diarrhea since being on the chemo Pill. She stated that she goes about 4 times a day when its her week on and she uses Imodium. Pt state that she has some mouth sores.Pt is on her 5th cycle  of Xeloda. Pt had her mammogram 2 wks ago.   All other systems were reviewed with the patient and are negative.  MEDICAL HISTORY:  Past Medical History:  Diagnosis Date   Colorectal cancer (HCC)    Diabetes mellitus without complication (HCC)    Hyperlipemia     SURGICAL HISTORY: Past Surgical History:  Procedure Laterality Date   COLON SURGERY      I have reviewed the social history and family history with the patient and they are unchanged from previous note.  ALLERGIES:  has No Known Allergies.  MEDICATIONS:  Current Outpatient Medications  Medication Sig Dispense Refill   capecitabine (XELODA) 500 MG tablet Take 3 tablets (1,500 mg total) by mouth 2 (two) times daily after a meal. Take within 30 minutes after meals. Take for 14 days, then off for 7 days. Repeat every 21 days. 84 tablet 2   ondansetron (ZOFRAN) 8 MG tablet Take 1 tablet (8 mg total) by mouth every 8 (eight) hours as needed for nausea or vomiting. Starting 3 days after chemotherapy 30 tablet 2   prochlorperazine (COMPAZINE) 10 MG tablet Take 1 tablet (10 mg total) by mouth every 6 (six) hours as needed. 30 tablet 2   No current facility-administered medications for this visit.    PHYSICAL EXAMINATION: ECOG PERFORMANCE STATUS: 0 - Asymptomatic  Vitals:   05/27/22 1019  BP: (!) 129/90  Pulse: (!) 111  Resp: 16  Temp: 98.3 F (36.8 C)  SpO2: 98%   Wt Readings from Last 3 Encounters:  05/27/22 210 lb 4.8 oz (95.4 kg)  05/06/22 205 lb 8 oz (93.2 kg)  04/15/22 204 lb (92.5 kg)     GENERAL:alert, no distress and comfortable SKIN: skin color normal, no rashes or significant lesions EYES: normal, Conjunctiva are pink and non-injected, sclera clear  NEURO: alert & oriented x 3 with fluent speech   LABORATORY DATA:  I have reviewed the data as listed    Latest Ref Rng & Units 05/27/2022    9:58 AM 05/06/2022    9:06 AM 04/15/2022    9:48 AM  CBC  WBC 4.0 - 10.5 K/uL 7.8  5.0  5.2   Hemoglobin  12.0 - 15.0 g/dL 60.4  54.0  98.1   Hematocrit 36.0 - 46.0 % 39.1  38.2  38.9   Platelets 150 - 400 K/uL 542  528  523         Latest Ref Rng & Units 05/27/2022    9:58 AM 05/06/2022    9:06 AM 04/15/2022    9:48 AM  CMP  Glucose 70 - 99 mg/dL 191  478  295   BUN 6 - 20 mg/dL 24  27  26    Creatinine 0.44 - 1.00 mg/dL 6.21  3.08  6.57   Sodium 135 - 145 mmol/L 138  139  140   Potassium 3.5 - 5.1 mmol/L 4.5  4.3  4.2   Chloride 98 - 111 mmol/L 104  106  107   CO2 22 - 32 mmol/L 27  25  23    Calcium 8.9 - 10.3 mg/dL 84.6  9.8  96.2   Total Protein 6.5 -  8.1 g/dL 8.0  8.4  8.3   Total Bilirubin 0.3 - 1.2 mg/dL 0.3  0.6  0.4   Alkaline Phos 38 - 126 U/L 77  69  73   AST 15 - 41 U/L 12  16  14    ALT 0 - 44 U/L 9  13  9        RADIOGRAPHIC STUDIES: I have personally reviewed the radiological images as listed and agreed with the findings in the report. No results found.    No orders of the defined types were placed in this encounter.  All questions were answered. The patient knows to call the clinic with any problems, questions or concerns. No barriers to learning was detected. The total time spent in the appointment was 25 minutes.     Malachy Mood, MD 05/27/2022   Carolin Coy, CMA, am acting as scribe for Malachy Mood, MD.   I have reviewed the above documentation for accuracy and completeness, and I agree with the above.

## 2022-05-27 NOTE — Assessment & Plan Note (Signed)
-  Sigmoid colon cancer, stage IIIb, pT3 N1a M0, MMR normal -Diagnosed in November 2023, status post surgical resection on January 05, 2022 --I recommend adjuvant chemotherapy CapeOx, she started on 03/04/22 , due to poor tolerance, she declined more IV chemo after his first cycle.  We switched to Xeloda from cycle 2, she is tolerating well overall.

## 2022-05-30 ENCOUNTER — Telehealth: Payer: Self-pay | Admitting: Hematology

## 2022-05-30 NOTE — Telephone Encounter (Signed)
Patient called to reschedule cancelled appointment with Dr Moishe Spice. Patient aware of date and time of appointments.

## 2022-06-06 ENCOUNTER — Other Ambulatory Visit: Payer: Self-pay

## 2022-06-06 ENCOUNTER — Inpatient Hospital Stay: Payer: Medicaid Other

## 2022-06-06 ENCOUNTER — Inpatient Hospital Stay: Payer: Medicaid Other | Attending: Physician Assistant | Admitting: Licensed Clinical Social Worker

## 2022-06-06 ENCOUNTER — Other Ambulatory Visit: Payer: Self-pay | Admitting: Licensed Clinical Social Worker

## 2022-06-06 DIAGNOSIS — Z803 Family history of malignant neoplasm of breast: Secondary | ICD-10-CM

## 2022-06-06 DIAGNOSIS — Z79899 Other long term (current) drug therapy: Secondary | ICD-10-CM | POA: Diagnosis not present

## 2022-06-06 DIAGNOSIS — Z79624 Long term (current) use of inhibitors of nucleotide synthesis: Secondary | ICD-10-CM | POA: Diagnosis not present

## 2022-06-06 DIAGNOSIS — Z9221 Personal history of antineoplastic chemotherapy: Secondary | ICD-10-CM | POA: Diagnosis not present

## 2022-06-06 DIAGNOSIS — R519 Headache, unspecified: Secondary | ICD-10-CM | POA: Diagnosis not present

## 2022-06-06 DIAGNOSIS — C19 Malignant neoplasm of rectosigmoid junction: Secondary | ICD-10-CM

## 2022-06-06 LAB — CBC WITH DIFFERENTIAL (CANCER CENTER ONLY)
Abs Immature Granulocytes: 0.01 10*3/uL (ref 0.00–0.07)
Basophils Absolute: 0.1 10*3/uL (ref 0.0–0.1)
Basophils Relative: 1 %
Eosinophils Absolute: 0.1 10*3/uL (ref 0.0–0.5)
Eosinophils Relative: 1 %
HCT: 39 % (ref 36.0–46.0)
Hemoglobin: 12 g/dL (ref 12.0–15.0)
Immature Granulocytes: 0 %
Lymphocytes Relative: 42 %
Lymphs Abs: 2.7 10*3/uL (ref 0.7–4.0)
MCH: 24.3 pg — ABNORMAL LOW (ref 26.0–34.0)
MCHC: 30.8 g/dL (ref 30.0–36.0)
MCV: 78.9 fL — ABNORMAL LOW (ref 80.0–100.0)
Monocytes Absolute: 0.4 10*3/uL (ref 0.1–1.0)
Monocytes Relative: 6 %
Neutro Abs: 3.3 10*3/uL (ref 1.7–7.7)
Neutrophils Relative %: 50 %
Platelet Count: 549 10*3/uL — ABNORMAL HIGH (ref 150–400)
RBC: 4.94 MIL/uL (ref 3.87–5.11)
RDW: 18.7 % — ABNORMAL HIGH (ref 11.5–15.5)
WBC Count: 6.6 10*3/uL (ref 4.0–10.5)
nRBC: 0 % (ref 0.0–0.2)

## 2022-06-06 LAB — CMP (CANCER CENTER ONLY)
ALT: 9 U/L (ref 0–44)
AST: 12 U/L — ABNORMAL LOW (ref 15–41)
Albumin: 4.1 g/dL (ref 3.5–5.0)
Alkaline Phosphatase: 67 U/L (ref 38–126)
Anion gap: 8 (ref 5–15)
BUN: 28 mg/dL — ABNORMAL HIGH (ref 6–20)
CO2: 26 mmol/L (ref 22–32)
Calcium: 9.9 mg/dL (ref 8.9–10.3)
Chloride: 106 mmol/L (ref 98–111)
Creatinine: 1.14 mg/dL — ABNORMAL HIGH (ref 0.44–1.00)
GFR, Estimated: 59 mL/min — ABNORMAL LOW (ref >60)
Glucose, Bld: 103 mg/dL — ABNORMAL HIGH (ref 70–99)
Potassium: 4.3 mmol/L (ref 3.5–5.1)
Sodium: 140 mmol/L (ref 135–145)
Total Bilirubin: 0.2 mg/dL — ABNORMAL LOW (ref 0.3–1.2)
Total Protein: 8.1 g/dL (ref 6.5–8.1)

## 2022-06-06 LAB — GENETIC SCREENING ORDER

## 2022-06-06 NOTE — Progress Notes (Signed)
REFERRING PROVIDER: Malachy Mood, MD [address] [place],  [place] [address]  PRIMARY PROVIDER:  Pcp, No  PRIMARY REASON FOR VISIT:  1. Colorectal cancer (HCC)      HISTORY OF PRESENT ILLNESS:   Ms. Gillen, a 47 y.o. female, was seen for a [place] cancer genetics consultation at the request of Dr. Mosetta Putt due to a personal history of colon cancer.  Ms. Mckane presents to clinic today to discuss the possibility of a hereditary predisposition to cancer, genetic testing, and to further clarify her future cancer risks, as well as potential cancer risks for family members.   In 2023, at the age of 35, Ms. Raum was diagnosed with colon cancer.  She underwent resection on 12/2021. She is currently being treated with adjuvnat chemotherapy.  CANCER HISTORY:  Oncology History Overview Note   Cancer Staging  Colorectal cancer [facility]) Staging form: Colon and Rectum, AJCC 8th Edition - Clinical stage from 02/25/2022: Stage IIIB (cT3, cN1a, cM0) - Signed by Heilingoetter, Cassandra L, PA-C on 02/25/2022 Total positive nodes: 1 Histologic grade (G): G2 Histologic grading system: 4 grade system     Colorectal cancer (HCC)  12/12/2021 Procedure   Repeat colonoscopy: -Distal rectal exam is normal and no palpable  -Sigmoid colon: 2 proliferative circumferential almost obstructing lesion.  Adult colonoscope could not pass through this lesion (biopsies taken).  Lesion is at 18 cm from the anal verge -Rigid sigmoidoscope the lesion is 18 cm from the anal verge.  Endoscopic diagnosis: Sigmoid mass almost obstructs the lumen suggestive of malignancy.    12/24/2021 Imaging   CT chest abdomen and pelvis  Conclusion: -1.  Known case of carcinoma of the sigmoid colon.  Segment of the sigmoid colon mural wall thickening with regional small lymph nodes. 2.  No suspicious distant metastatic lesion 3.  There is slight heterogeneous enhancement of the pancreatic head/uncanny process with some fatty  changes and fat stranding likely postinflammatory changes, focal pancreatitis/groove pancreatitis   01/05/2022 Pathology Results   Operation: Laparoscopic anterior resection -Tumor size 5.5 x 5 x 1 cm -Histologic grade: G2, moderately differentiated -Tumor extent: Invades through the muscularis propria into pericolonic tissue -All margins are free of invasive carcinoma -Number of lymph nodes involved: 1 -Number of lymph nodes examined: 23  Pathologic staging: PT3 N1a      02/25/2022 Initial Diagnosis   Colorectal cancer (HCC)   02/25/2022 Cancer Staging   Staging form: Colon and Rectum, AJCC 8th Edition - Clinical stage from 02/25/2022: Stage IIIB (cT3, cN1a, cM0) - Signed by Heilingoetter, Cassandra L, PA-C on 02/25/2022 Total positive nodes: 1 Histologic grade (G): G2 Histologic grading system: 4 grade system   03/04/2022 - 03/04/2022 Chemotherapy   Patient is on Treatment Plan : COLORECTAL Xelox (Capeox)(130/850) q21d       RISK FACTORS:  Menarche was at age 75.  First live birth at age 37.  Ovaries intact: yes.  Hysterectomy: no.  Menopausal status: postmenopausal.  Mammogram within the last year: yes. Number of breast biopsies: 0.  Past Medical History:  Diagnosis Date   Colorectal cancer (HCC)    Diabetes mellitus without complication (HCC)    Hyperlipemia     Past Surgical History:  Procedure Laterality Date   COLON SURGERY      FAMILY HISTORY:  We obtained a detailed, 4-generation family history.  Significant diagnoses are listed below: No family history on file.  Ms. Ruppe has 4 daughters, no cancers. She has 4 sisters and 5 brothers, no  cancers. No cancer on either side of  her family that she is aware of.   Ms. Norrick is unaware of previous family history of genetic testing for hereditary cancer risks. There is no reported Ashkenazi Jewish ancestry. There is no known consanguinity.    GENETIC COUNSELING ASSESSMENT: Ms. Hofferber is a 47 y.o. female with  a personal history of colon cancer which is somewhat suggestive of a hereditary cancer syndrome and predisposition to cancer. We, therefore, discussed and recommended the following at today's visit.   DISCUSSION: We discussed that approximately 10% of colorectal cancer is hereditary. Most cases of hereditary colorectal cancer are associated with Lynch syndrome genes, although there are other genes associated with hereditary cancer as well. Cancers and risks are gene specific. We discussed that testing is beneficial for several reasons including knowing about cancer risks, identifying potential screening and risk-reduction options that may be appropriate, and to understand if other family members could be at risk for cancer and allow them to undergo genetic testing.   We reviewed the characteristics, features and inheritance patterns of hereditary cancer syndromes. We also discussed genetic testing, including the appropriate family members to test, the process of testing, insurance coverage and turn-around-time for results. We discussed the implications of a negative, positive and/or variant of uncertain significant result. We recommended Ms. Picou pursue genetic testing for the [facility] Multi-Cancer+RNA gene panel.   Based on Ms. Towell's personal history of cancer, she meets medical criteria for genetic testing. Despite that she meets criteria, she may still have an out of pocket cost.   PLAN: After considering the risks, benefits, and limitations, Ms. Hovanec provided informed consent to pursue genetic testing and the blood sample was sent to [facility] for analysis of the Multi-Cancer+RNA panel. Results should be available within approximately 2-3 weeks' time, at which point they will be disclosed by telephone and MyChart message to Ms. Simcoe, as will any additional recommendations warranted by these results. Ms. Raia will receive a summary of her genetic counseling visit and a copy of  her results once available. This information will also be available in Epic.   Ms. Fitzke questions were answered to her satisfaction today. Our contact information was provided should additional questions or concerns arise. Thank you for the referral and allowing [place] to share in the care of your patient.   Lacy Duverney, MS, [facility] Genetic Counselor [email or web address] Phone: [phone]  The patient was seen for a total of 25 minutes in face-to-face genetic counseling.  Patient's daughter Buena Irish was also present. Dr. Orlie Dakin was available for discussion regarding this case.   _______________________________________________________________________ For Office Staff:  Number of people involved in session: 2 Was an Intern/ student involved with case: no

## 2022-06-08 ENCOUNTER — Other Ambulatory Visit: Payer: Self-pay

## 2022-06-08 ENCOUNTER — Emergency Department (HOSPITAL_COMMUNITY): Payer: Medicaid Other

## 2022-06-08 ENCOUNTER — Emergency Department (HOSPITAL_COMMUNITY)
Admission: EM | Admit: 2022-06-08 | Discharge: 2022-06-08 | Disposition: A | Discharge status: Home / Self Care | Payer: Medicaid Other | Attending: Emergency Medicine | Admitting: Emergency Medicine

## 2022-06-08 DIAGNOSIS — R109 Unspecified abdominal pain: Secondary | ICD-10-CM

## 2022-06-08 DIAGNOSIS — R1031 Right lower quadrant pain: Secondary | ICD-10-CM | POA: Insufficient documentation

## 2022-06-08 LAB — BASIC METABOLIC PANEL
Anion gap: 7 (ref 5–15)
BUN: 22 mg/dL — ABNORMAL HIGH (ref 6–20)
CO2: 25 mmol/L (ref 22–32)
Calcium: 9.6 mg/dL (ref 8.9–10.3)
Chloride: 106 mmol/L (ref 98–111)
Creatinine, Ser: 1.11 mg/dL — ABNORMAL HIGH (ref 0.44–1.00)
GFR, Estimated: >60 mL/min (ref >60)
Glucose, Bld: 184 mg/dL — ABNORMAL HIGH (ref 70–99)
Potassium: 4.1 mmol/L (ref 3.5–5.1)
Sodium: 138 mmol/L (ref 135–145)

## 2022-06-08 LAB — URINALYSIS, W/ REFLEX TO CULTURE (INFECTION SUSPECTED)
Bilirubin Urine: NEGATIVE
Glucose, UA: >=500 mg/dL — AB
Hgb urine dipstick: NEGATIVE
Ketones, ur: NEGATIVE mg/dL
Leukocytes,Ua: NEGATIVE
Nitrite: NEGATIVE
Protein, ur: NEGATIVE mg/dL
Specific Gravity, Urine: 1.013 (ref 1.005–1.030)
pH: 5 (ref 5.0–8.0)

## 2022-06-08 LAB — CBC
HCT: 42.1 % (ref 36.0–46.0)
Hemoglobin: 12.5 g/dL (ref 12.0–15.0)
MCH: 23.9 pg — ABNORMAL LOW (ref 26.0–34.0)
MCHC: 29.7 g/dL — ABNORMAL LOW (ref 30.0–36.0)
MCV: 80.5 fL (ref 80.0–100.0)
Platelets: 575 10*3/uL — ABNORMAL HIGH (ref 150–400)
RBC: 5.23 MIL/uL — ABNORMAL HIGH (ref 3.87–5.11)
RDW: 19 % — ABNORMAL HIGH (ref 11.5–15.5)
WBC: 5.7 10*3/uL (ref 4.0–10.5)
nRBC: 0 % (ref 0.0–0.2)

## 2022-06-08 MED ORDER — ACETAMINOPHEN 325 MG PO TABS
650.0000 mg | ORAL_TABLET | Freq: Once | ORAL | Status: AC
  Administered 2022-06-08: 650 mg via ORAL
  Filled 2022-06-08: qty 2

## 2022-06-08 MED ORDER — METHOCARBAMOL 500 MG PO TABS: 500.0000 mg | ORAL_TABLET | Freq: Two times a day (BID) | ORAL | 0 refills | Status: DC

## 2022-06-08 MED ORDER — OXYCODONE-ACETAMINOPHEN 5-325 MG PO TABS: 1.0000 | ORAL_TABLET | Freq: Four times a day (QID) | ORAL | 0 refills | Status: DC | PRN

## 2022-06-08 NOTE — ED Triage Notes (Addendum)
Pt arrived via POV. C/o bilat flank pain, and dysuria for 1x week.  Pt also notes raised itchy rash on cheeks.  Pt takes PO chemo for colon cx  Aox4  Accompanied by daughter

## 2022-06-08 NOTE — ED Provider Notes (Signed)
[place] EMERGENCY DEPARTMENT AT [facility] Provider Note   CSN: [id number] Arrival date & time: 06/08/22  0759     History  Chief Complaint  Patient presents with   Flank Pain    [name] is a 47 y.o. female.  47 year old female presents with right-sided flank pain.  Has had urinary symptoms.  Denies any fever or chills.  Does note some dysuria.  Denies any vaginal discharge.  Cough congestion.  Pain worse with certain movements.  No treatment use prior to arrival       Home Medications Prior to Admission medications   Medication Sig Start Date End Date Taking? Authorizing Provider  capecitabine (XELODA) 500 MG tablet Take 3 tablets (1,500 mg total) by mouth 2 (two) times daily after a meal. Take within 30 minutes after meals. Take for 14 days, then off for 7 days. Repeat every 21 days. 05/27/22   Malachy Mood, MD  ondansetron (ZOFRAN) 8 MG tablet Take 1 tablet (8 mg total) by mouth every 8 (eight) hours as needed for nausea or vomiting. Starting 3 days after chemotherapy 02/25/22   Heilingoetter, Cassandra L, PA-C  prochlorperazine (COMPAZINE) 10 MG tablet Take 1 tablet (10 mg total) by mouth every 6 (six) hours as needed. 02/25/22   Heilingoetter, Cassandra L, PA-C      Allergies    Patient has no known allergies.    Review of Systems   Review of Systems  All other systems reviewed and are negative.   Physical Exam Updated Vital Signs BP 124/88 (BP Location: Left Arm)   Pulse 99   Temp 98.4 F (36.9 C)   Resp 17   Ht 1.651 m (5\' 5" )   Wt 94.8 kg   SpO2 100%   BMI 34.78 kg/m  Physical Exam Vitals and nursing note reviewed.  Constitutional:      General: She is not in acute distress.    Appearance: Normal appearance. She is well-developed. She is not toxic-appearing.  HENT:     Head: Normocephalic and atraumatic.  Eyes:     General: Lids are normal.     Conjunctiva/sclera: Conjunctivae normal.     Pupils: Pupils are equal, round, and  reactive to light.  Neck:     Thyroid: No thyroid mass.     Trachea: No tracheal deviation.  Cardiovascular:     Rate and Rhythm: Normal rate and regular rhythm.     Heart sounds: Normal heart sounds. No murmur heard.    No gallop.  Pulmonary:     Effort: Pulmonary effort is normal. No respiratory distress.     Breath sounds: Normal breath sounds. No stridor. No decreased breath sounds, wheezing, rhonchi or rales.  Abdominal:     General: There is no distension.     Palpations: Abdomen is soft.     Tenderness: There is no abdominal tenderness. There is no rebound.  Musculoskeletal:        General: No tenderness. Normal range of motion.     Cervical back: Normal range of motion and neck supple.  Skin:    General: Skin is warm and dry.     Findings: No abrasion or rash.  Neurological:     Mental Status: She is alert and oriented to person, place, and time. Mental status is at baseline.     GCS: GCS eye subscore is 4. GCS verbal subscore is 5. GCS motor subscore is 6.     Cranial Nerves: No cranial nerve deficit.  Sensory: No sensory deficit.     Motor: Motor function is intact.  Psychiatric:        Attention and Perception: Attention normal.        Speech: Speech normal.        Behavior: Behavior normal.     ED Results / Procedures / Treatments   Labs (all labs ordered are listed, but only abnormal results are displayed) Labs Reviewed  BASIC METABOLIC PANEL - Abnormal; Notable for the following components:      Result Value   Glucose, Bld 184 (*)    BUN 22 (*)    Creatinine, Ser 1.11 (*)    All other components within normal limits  CBC - Abnormal; Notable for the following components:   RBC 5.23 (*)    MCH 23.9 (*)    MCHC 29.7 (*)    RDW 19.0 (*)    Platelets 575 (*)    All other components within normal limits  URINALYSIS, W/ REFLEX TO CULTURE (INFECTION SUSPECTED) - Abnormal; Notable for the following components:   Color, Urine STRAW (*)    Glucose, UA >=500  (*)    Bacteria, UA FEW (*)    All other components within normal limits    EKG None  Radiology No results found.  Procedures Procedures    Medications Ordered in ED Medications  acetaminophen (TYLENOL) tablet 650 mg (has no administration in time range)    ED Course/ Medical Decision Making/ A&P                             Medical Decision Making Amount and/or Complexity of Data Reviewed Labs: ordered. Radiology: ordered.  Risk OTC drugs.   Patient's labs are stable at this time.  Urinalysis is negative.  Renal CT recommendations with the colitis.  Suspect muscle skeletal etiology.  Will discharge        Final Clinical Impression(s) / ED Diagnoses Final diagnoses:  None    Rx / DC Orders ED Discharge Orders     None         Lorre Nick, MD 06/08/22 1318

## 2022-06-13 ENCOUNTER — Other Ambulatory Visit (HOSPITAL_COMMUNITY): Payer: Self-pay

## 2022-06-14 ENCOUNTER — Other Ambulatory Visit (HOSPITAL_COMMUNITY): Payer: Self-pay

## 2022-06-14 ENCOUNTER — Ambulatory Visit: Payer: Self-pay | Admitting: Licensed Clinical Social Worker

## 2022-06-14 ENCOUNTER — Encounter: Payer: Self-pay | Admitting: Licensed Clinical Social Worker

## 2022-06-14 ENCOUNTER — Telehealth: Payer: Self-pay | Admitting: Licensed Clinical Social Worker

## 2022-06-14 DIAGNOSIS — C19 Malignant neoplasm of rectosigmoid junction: Secondary | ICD-10-CM

## 2022-06-14 DIAGNOSIS — Z1379 Encounter for other screening for genetic and chromosomal anomalies: Secondary | ICD-10-CM | POA: Insufficient documentation

## 2022-06-14 NOTE — Progress Notes (Signed)
HPI:   [name] was previously seen in the [place] [facility] clinic due to a personal history of colon cancer and concerns regarding a hereditary predisposition to cancer. Please refer to our prior cancer genetics clinic note for more information regarding our discussion, assessment and recommendations, at the time. Ms. Starkes recent genetic test results were disclosed to her, as were recommendations warranted by these results. These results and recommendations are discussed in more detail below.  CANCER HISTORY:  Oncology History Overview Note   Cancer Staging  Colorectal cancer [facility]) Staging form: Colon and Rectum, AJCC 8th Edition - Clinical stage from 02/25/2022: Stage IIIB (cT3, cN1a, cM0) - Signed by Heilingoetter, Cassandra L, PA-C on 02/25/2022 Total positive nodes: 1 Histologic grade (G): G2 Histologic grading system: 4 grade system     Colorectal cancer (HCC)  12/12/2021 Procedure   Repeat colonoscopy: -Distal rectal exam is normal and no palpable  -Sigmoid colon: 2 proliferative circumferential almost obstructing lesion.  Adult colonoscope could not pass through this lesion (biopsies taken).  Lesion is at 18 cm from the anal verge -Rigid sigmoidoscope the lesion is 18 cm from the anal verge.  Endoscopic diagnosis: Sigmoid mass almost obstructs the lumen suggestive of malignancy.    12/24/2021 Imaging   CT chest abdomen and pelvis  Conclusion: -1.  Known case of carcinoma of the sigmoid colon.  Segment of the sigmoid colon mural wall thickening with regional small lymph nodes. 2.  No suspicious distant metastatic lesion 3.  There is slight heterogeneous enhancement of the pancreatic head/uncanny process with some fatty changes and fat stranding likely postinflammatory changes, focal pancreatitis/groove pancreatitis   01/05/2022 Pathology Results   Operation: Laparoscopic anterior resection -Tumor size 5.5 x 5 x 1 cm -Histologic grade: G2, moderately  differentiated -Tumor extent: Invades through the muscularis propria into pericolonic tissue -All margins are free of invasive carcinoma -Number of lymph nodes involved: 1 -Number of lymph nodes examined: 23  Pathologic staging: PT3 N1a      02/25/2022 Initial Diagnosis   Colorectal cancer (HCC)   02/25/2022 Cancer Staging   Staging form: Colon and Rectum, AJCC 8th Edition - Clinical stage from 02/25/2022: Stage IIIB (cT3, cN1a, cM0) - Signed by Heilingoetter, Cassandra L, PA-C on 02/25/2022 Total positive nodes: 1 Histologic grade (G): G2 Histologic grading system: 4 grade system   03/04/2022 - 03/04/2022 Chemotherapy   Patient is on Treatment Plan : COLORECTAL Xelox (Capeox)(130/850) q21d       FAMILY HISTORY:  We obtained a detailed, 4-generation family history.  Significant diagnoses are listed below: No family history on file.  Ms. Varney has 4 daughters, no cancers. She has 4 sisters and 5 brothers, no cancers. No cancer on either side of  her family that she is aware of.    Ms. Ellingboe is unaware of previous family history of genetic testing for hereditary cancer risks. There is no reported Ashkenazi Jewish ancestry. There is no known consanguinity.    GENETIC TEST RESULTS:  The [facility] Multi-Cancer+RNA Panel found no pathogenic mutations.   The Multi-Cancer + RNA Panel offered by [facility] includes sequencing and/or deletion/duplication analysis of the following 70 genes:  AIP*, ALK, APC*, ATM*, AXIN2*, BAP1*, BARD1*, BLM*, BMPR1A*, BRCA1*, BRCA2*, BRIP1*, CDC73*, CDH1*, CDK4, CDKN1B*, CDKN2A, CHEK2*, CTNNA1*, DICER1*, EPCAM, EGFR, FH*, FLCN*, GREM1, HOXB13, KIT, LZTR1, MAX*, MBD4, MEN1*, MET, MITF, MLH1*, MSH2*, MSH3*, MSH6*, MUTYH*, NF1*, NF2*, NTHL1*, PALB2*, PDGFRA, PMS2*, POLD1*, POLE*, POT1*, PRKAR1A*, PTCH1*, PTEN*, RAD51C*, RAD51D*, RB1*, RET, SDHA*, SDHAF2*, SDHB*,  SDHC*, SDHD*, SMAD4*, SMARCA4*, SMARCB1*, SMARCE1*, STK11*, SUFU*, TMEM127*, TP53*, TSC1*, TSC2*, VHL*. RNA  analysis is performed for * genes.   The test report has been scanned into EPIC and is located under the Molecular Pathology section of the Results Review tab.  A portion of the result report is included below for reference. Genetic testing reported out on 06/12/2022.     Even though a pathogenic variant was not identified, possible explanations for the cancer in the family may include: There may be no hereditary risk for cancer in the family. The cancers in Ms. Sprecher and/or her family may be sporadic/familial or due to other genetic and environmental factors. There may be a gene mutation in one of these genes that current testing methods cannot detect but that chance is small. There could be another gene that has not yet been discovered, or that we have not yet tested, that is responsible for the cancer diagnoses in the family.  It is also possible there is a hereditary cause for the cancer in the family that Ms. Kirby did not inherit.  Therefore, it is important to remain in touch with cancer genetics in the future so that we can continue to offer Ms. Meenan the most up to date genetic testing.   ADDITIONAL GENETIC TESTING:  We discussed with Ms. Bisson that her genetic testing was fairly extensive.  If there are additional relevant genes identified to increase cancer risk that can be analyzed in the future, we would be happy to discuss and coordinate this testing at that time.    CANCER SCREENING RECOMMENDATIONS:  Ms. Langer test result is considered negative (normal).  This means that we have not identified a hereditary cause for her personal history of cancer at this time.   An individual's cancer risk and medical management are not determined by genetic test results alone. Overall cancer risk assessment incorporates additional factors, including personal medical history, family history, and any available genetic information that may result in a personalized plan for cancer  prevention and surveillance. Therefore, it is recommended she continue to follow the cancer management and screening guidelines provided by her oncology and primary healthcare provider.  RECOMMENDATIONS FOR FAMILY MEMBERS:   Since she did not inherit a identifiable mutation in a cancer predisposition gene included on this panel, her children could not have inherited a known mutation from her in one of these genes. Individuals in this family might be at some increased risk of developing cancer, over the general population risk, due to the family history of cancer.  Individuals in the family should notify their providers of the family history of cancer. We recommend women in this family have a yearly mammogram beginning at age 24, or 89 years younger than the earliest onset of cancer, an annual clinical breast exam, and perform monthly breast self-exams.  Family members should have colonoscopies by at age 78, or earlier, as recommended by their providers.  FOLLOW-UP:  Lastly, we discussed with Ms. Breslin that cancer genetics is a rapidly advancing field and it is possible that new genetic tests will be appropriate for her and/or her family members in the future. We encouraged her to remain in contact with cancer genetics on an annual basis so we can update her personal and family histories and let her know of advances in cancer genetics that may benefit this family.   Our contact number was provided. Ms. Chao questions were answered to her satisfaction, and she knows she is welcome to call  us at anytime with additional questions or concerns.    Faith Rogue, MS, [facility] Genetic Counselor [email or web address] Phone: [phone]

## 2022-06-14 NOTE — Telephone Encounter (Signed)
I contacted Ms. Calise to discuss her genetic testing results. No pathogenic variants were identified in the 70 genes analyzed. Detailed clinic note to follow.   The test report has been scanned into EPIC and is located under the Molecular Pathology section of the Results Review tab.  A portion of the result report is included below for reference.      Lacy Duverney, MS, [facility] Genetic Counselor [email or web address] Phone: [phone]

## 2022-06-17 ENCOUNTER — Inpatient Hospital Stay (HOSPITAL_BASED_OUTPATIENT_CLINIC_OR_DEPARTMENT_OTHER): Payer: Medicaid Other | Admitting: Hematology

## 2022-06-17 ENCOUNTER — Encounter: Payer: Self-pay | Admitting: Hematology

## 2022-06-17 ENCOUNTER — Inpatient Hospital Stay: Payer: Medicaid Other

## 2022-06-17 DIAGNOSIS — C19 Malignant neoplasm of rectosigmoid junction: Secondary | ICD-10-CM

## 2022-06-17 DIAGNOSIS — D500 Iron deficiency anemia secondary to blood loss (chronic): Secondary | ICD-10-CM

## 2022-06-17 LAB — CMP (CANCER CENTER ONLY)
ALT: 9 U/L (ref 0–44)
AST: 12 U/L — ABNORMAL LOW (ref 15–41)
Albumin: 4.1 g/dL (ref 3.5–5.0)
Alkaline Phosphatase: 75 U/L (ref 38–126)
Anion gap: 8 (ref 5–15)
BUN: 26 mg/dL — ABNORMAL HIGH (ref 6–20)
CO2: 23 mmol/L (ref 22–32)
Calcium: 9.7 mg/dL (ref 8.9–10.3)
Chloride: 108 mmol/L (ref 98–111)
Creatinine: 1.15 mg/dL — ABNORMAL HIGH (ref 0.44–1.00)
GFR, Estimated: 59 mL/min — ABNORMAL LOW (ref >60)
Glucose, Bld: 177 mg/dL — ABNORMAL HIGH (ref 70–99)
Potassium: 4.3 mmol/L (ref 3.5–5.1)
Sodium: 139 mmol/L (ref 135–145)
Total Bilirubin: 0.3 mg/dL (ref 0.3–1.2)
Total Protein: 8 g/dL (ref 6.5–8.1)

## 2022-06-17 LAB — CBC WITH DIFFERENTIAL (CANCER CENTER ONLY)
Abs Immature Granulocytes: 0.01 10*3/uL (ref 0.00–0.07)
Basophils Absolute: 0.1 10*3/uL (ref 0.0–0.1)
Basophils Relative: 1 %
Eosinophils Absolute: 0.1 10*3/uL (ref 0.0–0.5)
Eosinophils Relative: 1 %
HCT: 40.2 % (ref 36.0–46.0)
Hemoglobin: 12.4 g/dL (ref 12.0–15.0)
Immature Granulocytes: 0 %
Lymphocytes Relative: 41 %
Lymphs Abs: 2.4 10*3/uL (ref 0.7–4.0)
MCH: 24.2 pg — ABNORMAL LOW (ref 26.0–34.0)
MCHC: 30.8 g/dL (ref 30.0–36.0)
MCV: 78.5 fL — ABNORMAL LOW (ref 80.0–100.0)
Monocytes Absolute: 0.3 10*3/uL (ref 0.1–1.0)
Monocytes Relative: 5 %
Neutro Abs: 3.1 10*3/uL (ref 1.7–7.7)
Neutrophils Relative %: 52 %
Platelet Count: 579 10*3/uL — ABNORMAL HIGH (ref 150–400)
RBC: 5.12 MIL/uL — ABNORMAL HIGH (ref 3.87–5.11)
RDW: 18 % — ABNORMAL HIGH (ref 11.5–15.5)
WBC Count: 5.9 10*3/uL (ref 4.0–10.5)
nRBC: 0 % (ref 0.0–0.2)

## 2022-06-17 LAB — FERRITIN: Ferritin: 11 ng/mL (ref 11–307)

## 2022-06-17 MED ORDER — ACYCLOVIR 5 % EX OINT: 1.0000 | TOPICAL_OINTMENT | Freq: Three times a day (TID) | CUTANEOUS | 0 refills | Status: DC

## 2022-06-17 NOTE — Progress Notes (Signed)
[facility]   [phone] [phone]   Clinic Follow up Note   Patient Care Team: Nechama Guard, FNP as PCP - General (Family Medicine) Malachy Mood, MD as Consulting Physician (Oncology) Default, Provider, MD as Technician Default, Provider, MD as Technician  Date of Service:  06/17/2022  CHIEF COMPLAINT: f/u of Colorectal cancer   CURRENT THERAPY:    Xeloda 3 tablets (1500 mg) 2 times daily 14 days on and 7 days off. She discontinued her oxaliplatin after cycle #1.    ASSESSMENT:  [name] is a 47 y.o. female with   Colorectal cancer (HCC) -Sigmoid colon cancer, stage IIIb, pT3 N1a M0, MMR normal -Diagnosed in November 2023, status post surgical resection on January 05, 2022 --I recommend adjuvant chemotherapy CapeOx, she started on 03/04/22 , due to poor tolerance, she declined more IV chemo after his first cycle.  We switched to Xeloda from cycle 2, she is tolerating well overall. -Lab reviewed, adequate for treatment, will proceed cycle 5 next Monday -Plan for total of 8 cycles if she tolerates well. -She was seen in the ED on Jun 08, 2022 for flank pain, CT of renal protocol was obtained which showed no stones, and no evidence of cancer recurrence.  I reviewed with patient. -She is tolerating Xeloda well, was started cycle 6 next Monday.  Plan for total of 8 cycles  Headache -Likely nonspecific, could be related to sleep deprivation -I encouraged her to take melatonin at bedtime, okay to use Tylenol or ibuprofen for headaches.  PLAN: -lab reviewed - I prescribe a topical antibiotic -repeat CT scan in 6 months. -She was started cycle 6 Xeloda next Monday -Xeloda to be finish in July/2024 -lab and f/u in 3 weeks before her trip  SUMMARY OF ONCOLOGIC HISTORY: Oncology History Overview Note   Cancer Staging  Colorectal cancer [facility]) Staging form: Colon and Rectum, AJCC 8th Edition - Clinical stage from 02/25/2022: Stage IIIB  (cT3, cN1a, cM0) - Signed by Heilingoetter, Cassandra L, PA-C on 02/25/2022 Total positive nodes: 1 Histologic grade (G): G2 Histologic grading system: 4 grade system     Colorectal cancer (HCC)  12/12/2021 Procedure   Repeat colonoscopy: -Distal rectal exam is normal and no palpable  -Sigmoid colon: 2 proliferative circumferential almost obstructing lesion.  Adult colonoscope could not pass through this lesion (biopsies taken).  Lesion is at 18 cm from the anal verge -Rigid sigmoidoscope the lesion is 18 cm from the anal verge.  Endoscopic diagnosis: Sigmoid mass almost obstructs the lumen suggestive of malignancy.    12/24/2021 Imaging   CT chest abdomen and pelvis  Conclusion: -1.  Known case of carcinoma of the sigmoid colon.  Segment of the sigmoid colon mural wall thickening with regional small lymph nodes. 2.  No suspicious distant metastatic lesion 3.  There is slight heterogeneous enhancement of the pancreatic head/uncanny process with some fatty changes and fat stranding likely postinflammatory changes, focal pancreatitis/groove pancreatitis   01/05/2022 Pathology Results   Operation: Laparoscopic anterior resection -Tumor size 5.5 x 5 x 1 cm -Histologic grade: G2, moderately differentiated -Tumor extent: Invades through the muscularis propria into pericolonic tissue -All margins are free of invasive carcinoma -Number of lymph nodes involved: 1 -Number of lymph nodes examined: 23  Pathologic staging: PT3 N1a      02/25/2022 Initial Diagnosis   Colorectal cancer (HCC)   02/25/2022 Cancer Staging   Staging form: Colon and Rectum, AJCC 8th Edition - Clinical stage from 02/25/2022: Stage  IIIB (cT3, cN1a, cM0) - Signed by Heilingoetter, Cassandra L, PA-C on 02/25/2022 Total positive nodes: 1 Histologic grade (G): G2 Histologic grading system: 4 grade system   03/04/2022 - 03/04/2022 Chemotherapy   Patient is on Treatment Plan : COLORECTAL Xelox (Capeox)(130/850) q21d       Genetic Testing   Negative genetic testing. No pathogenic variants identified on the [facility] Multi-Cancer+RNA panel. The report date is 06/12/2022.  The Multi-Cancer + RNA Panel offered by [facility] includes sequencing and/or deletion/duplication analysis of the following 70 genes:  AIP*, ALK, APC*, ATM*, AXIN2*, BAP1*, BARD1*, BLM*, BMPR1A*, BRCA1*, BRCA2*, BRIP1*, CDC73*, CDH1*, CDK4, CDKN1B*, CDKN2A, CHEK2*, CTNNA1*, DICER1*, EPCAM, EGFR, FH*, FLCN*, GREM1, HOXB13, KIT, LZTR1, MAX*, MBD4, MEN1*, MET, MITF, MLH1*, MSH2*, MSH3*, MSH6*, MUTYH*, NF1*, NF2*, NTHL1*, PALB2*, PDGFRA, PMS2*, POLD1*, POLE*, POT1*, PRKAR1A*, PTCH1*, PTEN*, RAD51C*, RAD51D*, RB1*, RET, SDHA*, SDHAF2*, SDHB*, SDHC*, SDHD*, SMAD4*, SMARCA4*, SMARCB1*, SMARCE1*, STK11*, SUFU*, TMEM127*, TP53*, TSC1*, TSC2*, VHL*. RNA analysis is performed for * genes.      INTERVAL HISTORY:  [name] is here for a follow up of Colorectal cancer . She was last seen by me on 05/27/2022. She presents to the clinic accompanied by daughter. Pt state she has a cold sore on her nose and its been there since Sunday. Pt report of having headaches with pressure on her eyes and back of the head for three days and rate the the pain at 7.She took Tylenol for pain Pt state that she doesn't sleep well ay night.       All other systems were reviewed with the patient and are negative.  MEDICAL HISTORY:  Past Medical History:  Diagnosis Date   Colorectal cancer (HCC)    Diabetes mellitus without complication (HCC)    Hyperlipemia     SURGICAL HISTORY: Past Surgical History:  Procedure Laterality Date   COLON SURGERY      I have reviewed the social history and family history with the patient and they are unchanged from previous note.  ALLERGIES:  has No Known Allergies.  MEDICATIONS:  Current Outpatient Medications  Medication Sig Dispense Refill   acyclovir ointment (ZOVIRAX) 5 % Apply 1 Application topically 3 (three) times daily. 30 g  0   capecitabine (XELODA) 500 MG tablet Take 3 tablets (1,500 mg total) by mouth 2 (two) times daily after a meal. Take within 30 minutes after meals. Take for 14 days, then off for 7 days. Repeat every 21 days. 84 tablet 2   methocarbamol (ROBAXIN) 500 MG tablet Take 1 tablet (500 mg total) by mouth 2 (two) times daily. 20 tablet 0   ondansetron (ZOFRAN) 8 MG tablet Take 1 tablet (8 mg total) by mouth every 8 (eight) hours as needed for nausea or vomiting. Starting 3 days after chemotherapy 30 tablet 2   oxyCODONE-acetaminophen (PERCOCET/ROXICET) 5-325 MG tablet Take 1 tablet by mouth every 6 (six) hours as needed for severe pain. 15 tablet 0   prochlorperazine (COMPAZINE) 10 MG tablet Take 1 tablet (10 mg total) by mouth every 6 (six) hours as needed. 30 tablet 2   No current facility-administered medications for this visit.    PHYSICAL EXAMINATION: ECOG PERFORMANCE STATUS: 1 - Symptomatic but completely ambulatory  There were no vitals filed for this visit. Wt Readings from Last 3 Encounters:  06/08/22 209 lb (94.8 kg)  05/27/22 210 lb 4.8 oz (95.4 kg)  05/06/22 205 lb 8 oz (93.2 kg)     GENERAL:alert, no distress and comfortable SKIN: skin color  normal, no rashes or significant lesions EYES: normal, Conjunctiva are pink and non-injected, sclera clear  NEURO: alert & oriented x 3 with fluent speech  LABORATORY DATA:  I have reviewed the data as listed    Latest Ref Rng & Units 06/17/2022    8:50 AM 06/08/2022    8:41 AM 06/06/2022   10:27 AM  CBC  WBC 4.0 - 10.5 K/uL 5.9  5.7  6.6   Hemoglobin 12.0 - 15.0 g/dL 16.1  09.6  04.5   Hematocrit 36.0 - 46.0 % 40.2  42.1  39.0   Platelets 150 - 400 K/uL 579  575  549         Latest Ref Rng & Units 06/17/2022    8:50 AM 06/08/2022    8:41 AM 06/06/2022   10:27 AM  CMP  Glucose 70 - 99 mg/dL 409  811  914   BUN 6 - 20 mg/dL 26  22  28    Creatinine 0.44 - 1.00 mg/dL 7.82  9.56  2.13   Sodium 135 - 145 mmol/L 139  138  140   Potassium  3.5 - 5.1 mmol/L 4.3  4.1  4.3   Chloride 98 - 111 mmol/L 108  106  106   CO2 22 - 32 mmol/L 23  25  26    Calcium 8.9 - 10.3 mg/dL 9.7  9.6  9.9   Total Protein 6.5 - 8.1 g/dL 8.0   8.1   Total Bilirubin 0.3 - 1.2 mg/dL 0.3   0.2   Alkaline Phos 38 - 126 U/L 75   67   AST 15 - 41 U/L 12   12   ALT 0 - 44 U/L 9   9       RADIOGRAPHIC STUDIES: I have personally reviewed the radiological images as listed and agreed with the findings in the report. No results found.    Orders Placed This Encounter  Procedures   CEA (IN HOUSE-[facility])    Standing Status:   Standing    Number of Occurrences:   5    Standing Expiration Date:   06/17/2023   All questions were answered. The patient knows to call the clinic with any problems, questions or concerns. No barriers to learning was detected. The total time spent in the appointment was 25 minutes.     Malachy Mood, MD 06/17/2022   Carolin Coy, CMA, am acting as scribe for Malachy Mood, MD.   I have reviewed the above documentation for accuracy and completeness, and I agree with the above.

## 2022-06-17 NOTE — Assessment & Plan Note (Addendum)
-  Sigmoid colon cancer, stage IIIb, pT3 N1a M0, MMR normal -Diagnosed in November 2023, status post surgical resection on January 05, 2022 --I recommend adjuvant chemotherapy CapeOx, she started on 03/04/22 , due to poor tolerance, she declined more IV chemo after his first cycle.  We switched to Xeloda from cycle 2, she is tolerating well overall. -Lab reviewed, adequate for treatment, will proceed cycle 5 next Monday -Plan for total of 8 cycles if she tolerates well. -She was seen in the ED on Jun 08, 2022 for flank pain, CT of renal protocol was obtained which showed no stones, and no evidence of cancer recurrence.  I reviewed with patient.

## 2022-06-30 ENCOUNTER — Other Ambulatory Visit (HOSPITAL_COMMUNITY): Payer: Self-pay

## 2022-07-04 ENCOUNTER — Other Ambulatory Visit (HOSPITAL_COMMUNITY): Payer: Self-pay

## 2022-07-05 ENCOUNTER — Other Ambulatory Visit (HOSPITAL_COMMUNITY): Payer: Self-pay

## 2022-07-07 ENCOUNTER — Telehealth: Payer: Self-pay | Admitting: Hematology

## 2022-07-07 ENCOUNTER — Inpatient Hospital Stay: Payer: Medicaid Other | Attending: Physician Assistant

## 2022-07-07 ENCOUNTER — Inpatient Hospital Stay: Payer: Medicaid Other | Admitting: Hematology

## 2022-07-07 DIAGNOSIS — E119 Type 2 diabetes mellitus without complications: Secondary | ICD-10-CM | POA: Insufficient documentation

## 2022-07-07 DIAGNOSIS — C19 Malignant neoplasm of rectosigmoid junction: Secondary | ICD-10-CM | POA: Insufficient documentation

## 2022-07-07 DIAGNOSIS — Z79624 Long term (current) use of inhibitors of nucleotide synthesis: Secondary | ICD-10-CM | POA: Insufficient documentation

## 2022-07-07 DIAGNOSIS — Z79899 Other long term (current) drug therapy: Secondary | ICD-10-CM | POA: Insufficient documentation

## 2022-07-07 DIAGNOSIS — E785 Hyperlipidemia, unspecified: Secondary | ICD-10-CM | POA: Insufficient documentation

## 2022-07-07 NOTE — Progress Notes (Signed)
[name]   Telephone:(336) [phone] Fax:(336) [phone]   Clinic Follow up Note   Patient Care Team: Nechama Guard, FNP as PCP - General (Family Medicine) Malachy Mood, MD as Consulting Physician (Oncology) Default, Provider, MD as Technician Default, Provider, MD as Technician  Date of Service:  07/08/2022  CHIEF COMPLAINT: f/u of Colorectal cancer   CURRENT THERAPY:    Xeloda 3 tablets (1500 mg) 2 times daily 14 days on and 7 days off. She discontinued her oxaliplatin after cycle #1.      ASSESSMENT: [name] is a 47 y.o. female with   Colorectal cancer (HCC) Sigmoid colon cancer, stage IIIb, pT3 N1a M0, MMR normal -Diagnosed in November 2023, status post surgical resection on January 05, 2022 --I recommend adjuvant chemotherapy CapeOx, she started on 03/04/22 , due to poor tolerance, she declined more IV chemo after his first cycle.  We switched to Xeloda from cycle 2, she is tolerating well overall. -Lab reviewed, adequate for treatment, will proceed cycle 5 next Monday -Plan for total of 8 cycles if she tolerates well. -She was seen in the ED on Jun 08, 2022 for flank pain, CT of renal protocol was obtained which showed no stones, and no evidence of cancer recurrence.  I reviewed with patient. -She is tolerating Xeloda well, was started cycle 7 next Monday.  Plan for total of 8 cycles -Due to her upcoming trip, she is not able to return in 3 weeks before cycle 8.  She has been tolerating very well, okay to proceed to cycle 8 without lab before --I discussed the risk of cancer recurrence in the future. I discussed the surveillance plan, which is a physical exam and lab test (including CBC, CMP and CEA) every 3 months for the first 2 years, then every 6-12 months, colonoscopy in one year, and surveilliance CT scan every 6-12 month for up to 5 year.  -Lab and follow-up in 2 months, will order surveillance CT scan on next visit      PLAN: -lab  reviewed -Encourage pt to stay hydrated. -repeat CT scan in 6 months  -lab and f/u in 2 month   SUMMARY OF ONCOLOGIC HISTORY: Oncology History Overview Note   Cancer Staging  Colorectal cancer [facility]) Staging form: Colon and Rectum, AJCC 8th Edition - Clinical stage from 02/25/2022: Stage IIIB (cT3, cN1a, cM0) - Signed by Heilingoetter, Cassandra L, PA-C on 02/25/2022 Total positive nodes: 1 Histologic grade (G): G2 Histologic grading system: 4 grade system     Colorectal cancer (HCC)  12/12/2021 Procedure   Repeat colonoscopy: -Distal rectal exam is normal and no palpable  -Sigmoid colon: 2 proliferative circumferential almost obstructing lesion.  Adult colonoscope could not pass through this lesion (biopsies taken).  Lesion is at 18 cm from the anal verge -Rigid sigmoidoscope the lesion is 18 cm from the anal verge.  Endoscopic diagnosis: Sigmoid mass almost obstructs the lumen suggestive of malignancy.    12/24/2021 Imaging   CT chest abdomen and pelvis  Conclusion: -1.  Known case of carcinoma of the sigmoid colon.  Segment of the sigmoid colon mural wall thickening with regional small lymph nodes. 2.  No suspicious distant metastatic lesion 3.  There is slight heterogeneous enhancement of the pancreatic head/uncanny process with some fatty changes and fat stranding likely postinflammatory changes, focal pancreatitis/groove pancreatitis   01/05/2022 Pathology Results   Operation: Laparoscopic anterior resection -Tumor size 5.5 x 5 x 1 cm -Histologic grade: G2, moderately differentiated -Tumor  extent: Invades through the muscularis propria into pericolonic tissue -All margins are free of invasive carcinoma -Number of lymph nodes involved: 1 -Number of lymph nodes examined: 23  Pathologic staging: PT3 N1a      02/25/2022 Initial Diagnosis   Colorectal cancer (HCC)   02/25/2022 Cancer Staging   Staging form: Colon and Rectum, AJCC 8th Edition - Clinical stage from  02/25/2022: Stage IIIB (cT3, cN1a, cM0) - Signed by Heilingoetter, Cassandra L, PA-C on 02/25/2022 Total positive nodes: 1 Histologic grade (G): G2 Histologic grading system: 4 grade system   03/04/2022 - 03/04/2022 Chemotherapy   Patient is on Treatment Plan : COLORECTAL Xelox (Capeox)(130/850) q21d      Genetic Testing   Negative genetic testing. No pathogenic variants identified on the [facility] Multi-Cancer+RNA panel. The report date is 06/12/2022.  The Multi-Cancer + RNA Panel offered by [facility] includes sequencing and/or deletion/duplication analysis of the following 70 genes:  AIP*, ALK, APC*, ATM*, AXIN2*, BAP1*, BARD1*, BLM*, BMPR1A*, BRCA1*, BRCA2*, BRIP1*, CDC73*, CDH1*, CDK4, CDKN1B*, CDKN2A, CHEK2*, CTNNA1*, DICER1*, EPCAM, EGFR, FH*, FLCN*, GREM1, HOXB13, KIT, LZTR1, MAX*, MBD4, MEN1*, MET, MITF, MLH1*, MSH2*, MSH3*, MSH6*, MUTYH*, NF1*, NF2*, NTHL1*, PALB2*, PDGFRA, PMS2*, POLD1*, POLE*, POT1*, PRKAR1A*, PTCH1*, PTEN*, RAD51C*, RAD51D*, RB1*, RET, SDHA*, SDHAF2*, SDHB*, SDHC*, SDHD*, SMAD4*, SMARCA4*, SMARCB1*, SMARCE1*, STK11*, SUFU*, TMEM127*, TP53*, TSC1*, TSC2*, VHL*. RNA analysis is performed for * genes.      INTERVAL HISTORY:  [name] is here for a follow up of Colorectal cancer. She was last seen by me on 06/17/2022. She presents to the clinic accompanied by daughter. Pt state that has no complaints from last cycle.     All other systems were reviewed with the patient and are negative.  MEDICAL HISTORY:  Past Medical History:  Diagnosis Date   Colorectal cancer (HCC)    Diabetes mellitus without complication (HCC)    Hyperlipemia     SURGICAL HISTORY: Past Surgical History:  Procedure Laterality Date   COLON SURGERY      I have reviewed the social history and family history with the patient and they are unchanged from previous note.  ALLERGIES:  has No Known Allergies.  MEDICATIONS:  Current Outpatient Medications  Medication Sig Dispense Refill    acyclovir ointment (ZOVIRAX) 5 % Apply 1 Application topically 3 (three) times daily. 30 g 0   capecitabine (XELODA) 500 MG tablet Take 3 tablets (1,500 mg total) by mouth 2 (two) times daily after a meal. Take within 30 minutes after meals. Take for 14 days, then off for 7 days. Repeat every 21 days. 84 tablet 2   methocarbamol (ROBAXIN) 500 MG tablet Take 1 tablet (500 mg total) by mouth 2 (two) times daily. 20 tablet 0   ondansetron (ZOFRAN) 8 MG tablet Take 1 tablet (8 mg total) by mouth every 8 (eight) hours as needed for nausea or vomiting. Starting 3 days after chemotherapy 30 tablet 2   oxyCODONE-acetaminophen (PERCOCET/ROXICET) 5-325 MG tablet Take 1 tablet by mouth every 6 (six) hours as needed for severe pain. 15 tablet 0   prochlorperazine (COMPAZINE) 10 MG tablet Take 1 tablet (10 mg total) by mouth every 6 (six) hours as needed. 30 tablet 2   No current facility-administered medications for this visit.    PHYSICAL EXAMINATION: ECOG PERFORMANCE STATUS: 0 - Asymptomatic  Vitals:   07/08/22 0951  BP: 107/86  Pulse: 97  Resp: 18  Temp: 98.3 F (36.8 C)  SpO2: 100%   Wt Readings from Last 3 Encounters:  07/08/22 215 lb 4.8 oz (97.7 kg)  06/08/22 209 lb (94.8 kg)  05/27/22 210 lb 4.8 oz (95.4 kg)     GENERAL:alert, no distress and comfortable SKIN: skin color normal, no rashes or significant lesions EYES: normal, Conjunctiva are pink and non-injected, sclera clear  NEURO: alert & oriented x 3 with fluent speech LABORATORY DATA:  I have reviewed the data as listed    Latest Ref Rng & Units 07/08/2022    9:34 AM 06/17/2022    8:50 AM 06/08/2022    8:41 AM  CBC  WBC 4.0 - 10.5 K/uL 5.3  5.9  5.7   Hemoglobin 12.0 - 15.0 g/dL 16.1  09.6  04.5   Hematocrit 36.0 - 46.0 % 40.1  40.2  42.1   Platelets 150 - 400 K/uL 532  579  575         Latest Ref Rng & Units 06/17/2022    8:50 AM 06/08/2022    8:41 AM 06/06/2022   10:27 AM  CMP  Glucose 70 - 99 mg/dL 409  811  914   BUN  6 - 20 mg/dL 26  22  28    Creatinine 0.44 - 1.00 mg/dL 7.82  9.56  2.13   Sodium 135 - 145 mmol/L 139  138  140   Potassium 3.5 - 5.1 mmol/L 4.3  4.1  4.3   Chloride 98 - 111 mmol/L 108  106  106   CO2 22 - 32 mmol/L 23  25  26    Calcium 8.9 - 10.3 mg/dL 9.7  9.6  9.9   Total Protein 6.5 - 8.1 g/dL 8.0   8.1   Total Bilirubin 0.3 - 1.2 mg/dL 0.3   0.2   Alkaline Phos 38 - 126 U/L 75   67   AST 15 - 41 U/L 12   12   ALT 0 - 44 U/L 9   9       RADIOGRAPHIC STUDIES: I have personally reviewed the radiological images as listed and agreed with the findings in the report. No results found.    No orders of the defined types were placed in this encounter.  All questions were answered. The patient knows to call the clinic with any problems, questions or concerns. No barriers to learning was detected. The total time spent in the appointment was 25 minutes.     Malachy Mood, MD 07/08/2022   Carolin Coy, CMA, am acting as scribe for Malachy Mood, MD.   I have reviewed the above documentation for accuracy and completeness, and I agree with the above.

## 2022-07-07 NOTE — Assessment & Plan Note (Deleted)
-  Sigmoid colon cancer, stage IIIb, pT3 N1a M0, MMR normal -Diagnosed in November 2023, status post surgical resection on January 05, 2022 --I recommend adjuvant chemotherapy CapeOx, she started on 03/04/22 , due to poor tolerance, she declined more IV chemo after his first cycle.  We switched to Xeloda from cycle 2, she is tolerating well overall. -Lab reviewed, adequate for treatment, will proceed cycle 5 next Monday -Plan for total of 8 cycles if she tolerates well. -She was seen in the ED on Jun 08, 2022 for flank pain, CT of renal protocol was obtained which showed no stones, and no evidence of cancer recurrence.  I reviewed with patient. -She is tolerating Xeloda well, was started cycle 7 next Monday.  Plan for total of 8 cycles

## 2022-07-07 NOTE — Progress Notes (Deleted)
[name]   [phone] [phone]   Clinic Follow up Note   Patient Care Team: Ntibrey, Daniel K, FNP as PCP - General (Family Medicine) Feng, Yan, MD as Consulting Physician (Oncology) Default, Provider, MD as Technician Default, Provider, MD as Technician  Date of Service:  07/07/2022  CHIEF COMPLAINT: f/u of Colorectal cancer   CURRENT THERAPY:    Xeloda 3 tablets (1500 mg) 2 times daily 14 days on and 7 days off. She discontinued her oxaliplatin after cycle #1.      ASSESSMENT: *** [name] is a 46 y.o. female with   No problem-specific Assessment & Plan notes found for this encounter.  ***   PLAN:    SUMMARY OF ONCOLOGIC HISTORY: Oncology History Overview Note   Cancer Staging  Colorectal cancer (HCC) Staging form: Colon and Rectum, AJCC 8th Edition - Clinical stage from 02/25/2022: Stage IIIB (cT3, cN1a, cM0) - Signed by Heilingoetter, Cassandra L, PA-C on 02/25/2022 Total positive nodes: 1 Histologic grade (G): G2 Histologic grading system: 4 grade system     Colorectal cancer (HCC)  12/12/2021 Procedure   Repeat colonoscopy: -Distal rectal exam is normal and no palpable  -Sigmoid colon: 2 proliferative circumferential almost obstructing lesion.  Adult colonoscope could not pass through this lesion (biopsies taken).  Lesion is at 18 cm from the anal verge -Rigid sigmoidoscope the lesion is 18 cm from the anal verge.  Endoscopic diagnosis: Sigmoid mass almost obstructs the lumen suggestive of malignancy.    12/24/2021 Imaging   CT chest abdomen and pelvis  Conclusion: -1.  Known case of carcinoma of the sigmoid colon.  Segment of the sigmoid colon mural wall thickening with regional small lymph nodes. 2.  No suspicious distant metastatic lesion 3.  There is slight heterogeneous enhancement of the pancreatic head/uncanny process with some fatty changes and fat stranding likely postinflammatory changes, focal  pancreatitis/groove pancreatitis   01/05/2022 Pathology Results   Operation: Laparoscopic anterior resection -Tumor size 5.5 x 5 x 1 cm -Histologic grade: G2, moderately differentiated -Tumor extent: Invades through the muscularis propria into pericolonic tissue -All margins are free of invasive carcinoma -Number of lymph nodes involved: 1 -Number of lymph nodes examined: 23  Pathologic staging: PT3 N1a      02/25/2022 Initial Diagnosis   Colorectal cancer (HCC)   02/25/2022 Cancer Staging   Staging form: Colon and Rectum, AJCC 8th Edition - Clinical stage from 02/25/2022: Stage IIIB (cT3, cN1a, cM0) - Signed by Heilingoetter, Cassandra L, PA-C on 02/25/2022 Total positive nodes: 1 Histologic grade (G): G2 Histologic grading system: 4 grade system   03/04/2022 - 03/04/2022 Chemotherapy   Patient is on Treatment Plan : COLORECTAL Xelox (Capeox)(130/850) q21d      Genetic Testing   Negative genetic testing. No pathogenic variants identified on the [facility] Multi-Cancer+RNA panel. The report date is 06/12/2022.  The Multi-Cancer + RNA Panel offered by [facility] includes sequencing and/or deletion/duplication analysis of the following 70 genes:  AIP*, ALK, APC*, ATM*, AXIN2*, BAP1*, BARD1*, BLM*, BMPR1A*, BRCA1*, BRCA2*, BRIP1*, CDC73*, CDH1*, CDK4, CDKN1B*, CDKN2A, CHEK2*, CTNNA1*, DICER1*, EPCAM, EGFR, FH*, FLCN*, GREM1, HOXB13, KIT, LZTR1, MAX*, MBD4, MEN1*, MET, MITF, MLH1*, MSH2*, MSH3*, MSH6*, MUTYH*, NF1*, NF2*, NTHL1*, PALB2*, PDGFRA, PMS2*, POLD1*, POLE*, POT1*, PRKAR1A*, PTCH1*, PTEN*, RAD51C*, RAD51D*, RB1*, RET, SDHA*, SDHAF2*, SDHB*, SDHC*, SDHD*, SMAD4*, SMARCA4*, SMARCB1*, SMARCE1*, STK11*, SUFU*, TMEM127*, TP53*, TSC1*, TSC2*, VHL*. RNA analysis is performed for * genes.      INTERVAL HISTORY: *** [name] is here   for a follow up of Colorectal cancer. She was last seen by me on 06/17/2022. She presents to the clinic      All other systems were reviewed with the patient  and are negative.  MEDICAL HISTORY:  Past Medical History:  Diagnosis Date   Colorectal cancer (HCC)    Diabetes mellitus without complication (HCC)    Hyperlipemia     SURGICAL HISTORY: Past Surgical History:  Procedure Laterality Date   COLON SURGERY      I have reviewed the social history and family history with the patient and they are unchanged from previous note.  ALLERGIES:  has No Known Allergies.  MEDICATIONS:  Current Outpatient Medications  Medication Sig Dispense Refill   acyclovir ointment (ZOVIRAX) 5 % Apply 1 Application topically 3 (three) times daily. 30 g 0   capecitabine (XELODA) 500 MG tablet Take 3 tablets (1,500 mg total) by mouth 2 (two) times daily after a meal. Take within 30 minutes after meals. Take for 14 days, then off for 7 days. Repeat every 21 days. 84 tablet 2   methocarbamol (ROBAXIN) 500 MG tablet Take 1 tablet (500 mg total) by mouth 2 (two) times daily. 20 tablet 0   ondansetron (ZOFRAN) 8 MG tablet Take 1 tablet (8 mg total) by mouth every 8 (eight) hours as needed for nausea or vomiting. Starting 3 days after chemotherapy 30 tablet 2   oxyCODONE-acetaminophen (PERCOCET/ROXICET) 5-325 MG tablet Take 1 tablet by mouth every 6 (six) hours as needed for severe pain. 15 tablet 0   prochlorperazine (COMPAZINE) 10 MG tablet Take 1 tablet (10 mg total) by mouth every 6 (six) hours as needed. 30 tablet 2   No current facility-administered medications for this visit.    PHYSICAL EXAMINATION: ECOG PERFORMANCE STATUS: {CHL ONC ECOG PS:[id number]}  There were no vitals filed for this visit. Wt Readings from Last 3 Encounters:  06/08/22 209 lb (94.8 kg)  05/27/22 210 lb 4.8 oz (95.4 kg)  05/06/22 205 lb 8 oz (93.2 kg)    {Only keep what was examined. If exam not performed, can use .CEXAM } GENERAL:alert, no distress and comfortable SKIN: skin color, texture, turgor are normal, no rashes or significant lesions EYES: normal, Conjunctiva are pink and  non-injected, sclera clear {OROPHARYNX:no exudate, no erythema and lips, buccal mucosa, and tongue normal}  NECK: supple, thyroid normal size, non-tender, without nodularity LYMPH:  no palpable lymphadenopathy in the cervical, axillary {or inguinal} LUNGS: clear to auscultation and percussion with normal breathing effort HEART: regular rate & rhythm and no murmurs and no lower extremity edema ABDOMEN:abdomen soft, non-tender and normal bowel sounds Musculoskeletal:no cyanosis of digits and no clubbing  NEURO: alert & oriented x 3 with fluent speech, no focal motor/sensory deficits  LABORATORY DATA:  I have reviewed the data as listed    Latest Ref Rng & Units 06/17/2022    8:50 AM 06/08/2022    8:41 AM 06/06/2022   10:27 AM  CBC  WBC 4.0 - 10.5 K/uL 5.9  5.7  6.6   Hemoglobin 12.0 - 15.0 g/dL 12.4  12.5  12.0   Hematocrit 36.0 - 46.0 % 40.2  42.1  39.0   Platelets 150 - 400 K/uL 579  575  549         Latest Ref Rng & Units 06/17/2022    8:50 AM 06/08/2022    8:41 AM 06/06/2022   10:27 AM  CMP  Glucose 70 - 99 mg/dL 177  184  103     BUN 6 - 20 mg/dL 26  22  28   Creatinine 0.44 - 1.00 mg/dL 1.15  1.11  1.14   Sodium 135 - 145 mmol/L 139  138  140   Potassium 3.5 - 5.1 mmol/L 4.3  4.1  4.3   Chloride 98 - 111 mmol/L 108  106  106   CO2 22 - 32 mmol/L 23  25  26   Calcium 8.9 - 10.3 mg/dL 9.7  9.6  9.9   Total Protein 6.5 - 8.1 g/dL 8.0   8.1   Total Bilirubin 0.3 - 1.2 mg/dL 0.3   0.2   Alkaline Phos 38 - 126 U/L 75   67   AST 15 - 41 U/L 12   12   ALT 0 - 44 U/L 9   9       RADIOGRAPHIC STUDIES: I have personally reviewed the radiological images as listed and agreed with the findings in the report. No results found.    No orders of the defined types were placed in this encounter.  All questions were answered. The patient knows to call the clinic with any problems, questions or concerns. No barriers to learning was detected. The total time spent in the appointment was {CHL  ONC TIME VISIT - SWIFT:[id number]}.     Azaylea Maves N Ashle Stief, CMA 07/07/2022   I, Falon Flinchum, CMA, am acting as scribe for Yan Feng, MD.   {Add scribe attestation statement}    

## 2022-07-07 NOTE — Telephone Encounter (Signed)
Contacted patient to scheduled appointments. Left message with appointment details and a call back number if patient had any questions or could not accommodate the time we provided.   

## 2022-07-08 ENCOUNTER — Encounter: Payer: Self-pay | Admitting: Hematology

## 2022-07-08 ENCOUNTER — Inpatient Hospital Stay (HOSPITAL_BASED_OUTPATIENT_CLINIC_OR_DEPARTMENT_OTHER): Payer: Medicaid Other | Admitting: Hematology

## 2022-07-08 ENCOUNTER — Inpatient Hospital Stay: Payer: Medicaid Other

## 2022-07-08 ENCOUNTER — Other Ambulatory Visit: Payer: Self-pay

## 2022-07-08 VITALS — BP 107/86 | HR 97 | Temp 98.3°F | Resp 18 | Ht 65.0 in | Wt 215.3 lb

## 2022-07-08 DIAGNOSIS — E785 Hyperlipidemia, unspecified: Secondary | ICD-10-CM | POA: Diagnosis not present

## 2022-07-08 DIAGNOSIS — E119 Type 2 diabetes mellitus without complications: Secondary | ICD-10-CM | POA: Diagnosis not present

## 2022-07-08 DIAGNOSIS — C19 Malignant neoplasm of rectosigmoid junction: Secondary | ICD-10-CM

## 2022-07-08 DIAGNOSIS — D500 Iron deficiency anemia secondary to blood loss (chronic): Secondary | ICD-10-CM

## 2022-07-08 DIAGNOSIS — Z79624 Long term (current) use of inhibitors of nucleotide synthesis: Secondary | ICD-10-CM | POA: Diagnosis not present

## 2022-07-08 DIAGNOSIS — Z79899 Other long term (current) drug therapy: Secondary | ICD-10-CM | POA: Diagnosis not present

## 2022-07-08 LAB — CMP (CANCER CENTER ONLY)
ALT: 12 U/L (ref 0–44)
AST: 15 U/L (ref 15–41)
Albumin: 4 g/dL (ref 3.5–5.0)
Alkaline Phosphatase: 71 U/L (ref 38–126)
Anion gap: 7 (ref 5–15)
BUN: 24 mg/dL — ABNORMAL HIGH (ref 6–20)
CO2: 27 mmol/L (ref 22–32)
Calcium: 10 mg/dL (ref 8.9–10.3)
Chloride: 106 mmol/L (ref 98–111)
Creatinine: 1.16 mg/dL — ABNORMAL HIGH (ref 0.44–1.00)
GFR, Estimated: 58 mL/min — ABNORMAL LOW (ref >60)
Glucose, Bld: 198 mg/dL — ABNORMAL HIGH (ref 70–99)
Potassium: 4.3 mmol/L (ref 3.5–5.1)
Sodium: 140 mmol/L (ref 135–145)
Total Bilirubin: 0.3 mg/dL (ref 0.3–1.2)
Total Protein: 8.1 g/dL (ref 6.5–8.1)

## 2022-07-08 LAB — CBC WITH DIFFERENTIAL (CANCER CENTER ONLY)
Abs Immature Granulocytes: 0.01 10*3/uL (ref 0.00–0.07)
Basophils Absolute: 0.1 10*3/uL (ref 0.0–0.1)
Basophils Relative: 1 %
Eosinophils Absolute: 0.1 10*3/uL (ref 0.0–0.5)
Eosinophils Relative: 1 %
HCT: 40.1 % (ref 36.0–46.0)
Hemoglobin: 12.3 g/dL (ref 12.0–15.0)
Immature Granulocytes: 0 %
Lymphocytes Relative: 36 %
Lymphs Abs: 1.9 10*3/uL (ref 0.7–4.0)
MCH: 24.6 pg — ABNORMAL LOW (ref 26.0–34.0)
MCHC: 30.7 g/dL (ref 30.0–36.0)
MCV: 80 fL (ref 80.0–100.0)
Monocytes Absolute: 0.3 10*3/uL (ref 0.1–1.0)
Monocytes Relative: 6 %
Neutro Abs: 3 10*3/uL (ref 1.7–7.7)
Neutrophils Relative %: 56 %
Platelet Count: 532 10*3/uL — ABNORMAL HIGH (ref 150–400)
RBC: 5.01 MIL/uL (ref 3.87–5.11)
RDW: 17.7 % — ABNORMAL HIGH (ref 11.5–15.5)
WBC Count: 5.3 10*3/uL (ref 4.0–10.5)
nRBC: 0 % (ref 0.0–0.2)

## 2022-07-08 LAB — FERRITIN: Ferritin: 17 ng/mL (ref 11–307)

## 2022-07-08 LAB — CEA (IN HOUSE-CHCC): CEA (CHCC-In House): 1.75 ng/mL (ref 0.00–5.00)

## 2022-07-08 NOTE — Assessment & Plan Note (Signed)
-  Sigmoid colon cancer, stage IIIb, pT3 N1a M0, MMR normal -Diagnosed in November 2023, status post surgical resection on January 05, 2022 --I recommend adjuvant chemotherapy CapeOx, she started on 03/04/22 , due to poor tolerance, she declined more IV chemo after his first cycle.  We switched to Xeloda from cycle 2, she is tolerating well overall. -Lab reviewed, adequate for treatment, will proceed cycle 5 next Monday -Plan for total of 8 cycles if she tolerates well. -She was seen in the ED on Jun 08, 2022 for flank pain, CT of renal protocol was obtained which showed no stones, and no evidence of cancer recurrence.  I reviewed with patient. -She is tolerating Xeloda well, was started cycle 7 next Monday.  Plan for total of 8 cycles 

## 2022-07-21 ENCOUNTER — Other Ambulatory Visit (HOSPITAL_COMMUNITY): Payer: Self-pay

## 2022-07-25 ENCOUNTER — Other Ambulatory Visit (HOSPITAL_COMMUNITY): Payer: Self-pay

## 2022-07-27 ENCOUNTER — Other Ambulatory Visit (HOSPITAL_COMMUNITY): Payer: Self-pay

## 2022-08-29 ENCOUNTER — Other Ambulatory Visit: Payer: Self-pay

## 2022-08-29 ENCOUNTER — Other Ambulatory Visit (HOSPITAL_COMMUNITY): Payer: Self-pay

## 2022-09-11 NOTE — Assessment & Plan Note (Addendum)
Sigmoid colon cancer, stage IIIb, pT3 N1a M0, MMR normal -Diagnosed in November 2023, status post surgical resection on January 05, 2022 --I recommended adjuvant chemotherapy CapeOx, she started on 03/04/22 , due to poor tolerance, she declined more IV chemo after her first cycle.  We switched to Xeloda from cycle 2, she is tolerating well overall. -She was seen in the ED on Jun 08, 2022 for flank pain, CT of renal protocol was obtained which showed no stones, and no evidence of cancer recurrence.  I reviewed with patient. -She completed cycle 8 Xeloda in early August 2024 -Plan to repeat a CT chest, abdomen pelvis in November 2024.

## 2022-09-12 ENCOUNTER — Other Ambulatory Visit: Payer: Self-pay

## 2022-09-12 ENCOUNTER — Encounter: Payer: Self-pay | Admitting: Hematology

## 2022-09-12 ENCOUNTER — Inpatient Hospital Stay (HOSPITAL_BASED_OUTPATIENT_CLINIC_OR_DEPARTMENT_OTHER): Payer: Medicaid Other | Admitting: Hematology

## 2022-09-12 ENCOUNTER — Inpatient Hospital Stay: Payer: Medicaid Other | Attending: Physician Assistant

## 2022-09-12 VITALS — BP 126/90 | HR 96 | Temp 97.8°F | Resp 15 | Ht 65.0 in | Wt 216.3 lb

## 2022-09-12 DIAGNOSIS — C19 Malignant neoplasm of rectosigmoid junction: Secondary | ICD-10-CM

## 2022-09-12 DIAGNOSIS — Z85048 Personal history of other malignant neoplasm of rectum, rectosigmoid junction, and anus: Secondary | ICD-10-CM | POA: Diagnosis present

## 2022-09-12 DIAGNOSIS — D500 Iron deficiency anemia secondary to blood loss (chronic): Secondary | ICD-10-CM

## 2022-09-12 DIAGNOSIS — Z9221 Personal history of antineoplastic chemotherapy: Secondary | ICD-10-CM | POA: Insufficient documentation

## 2022-09-12 LAB — CBC WITH DIFFERENTIAL (CANCER CENTER ONLY)
Abs Immature Granulocytes: 0.02 10*3/uL (ref 0.00–0.07)
Basophils Absolute: 0.1 10*3/uL (ref 0.0–0.1)
Basophils Relative: 1 %
Eosinophils Absolute: 0.1 10*3/uL (ref 0.0–0.5)
Eosinophils Relative: 1 %
HCT: 41.3 % (ref 36.0–46.0)
Hemoglobin: 12.8 g/dL (ref 12.0–15.0)
Immature Granulocytes: 0 %
Lymphocytes Relative: 33 %
Lymphs Abs: 2.1 10*3/uL (ref 0.7–4.0)
MCH: 24.3 pg — ABNORMAL LOW (ref 26.0–34.0)
MCHC: 31 g/dL (ref 30.0–36.0)
MCV: 78.5 fL — ABNORMAL LOW (ref 80.0–100.0)
Monocytes Absolute: 0.3 10*3/uL (ref 0.1–1.0)
Monocytes Relative: 5 %
Neutro Abs: 3.9 10*3/uL (ref 1.7–7.7)
Neutrophils Relative %: 60 %
Platelet Count: 542 10*3/uL — ABNORMAL HIGH (ref 150–400)
RBC: 5.26 MIL/uL — ABNORMAL HIGH (ref 3.87–5.11)
RDW: 17 % — ABNORMAL HIGH (ref 11.5–15.5)
WBC Count: 6.4 10*3/uL (ref 4.0–10.5)
nRBC: 0 % (ref 0.0–0.2)

## 2022-09-12 LAB — CMP (CANCER CENTER ONLY)
ALT: 11 U/L (ref 0–44)
AST: 12 U/L — ABNORMAL LOW (ref 15–41)
Albumin: 4 g/dL (ref 3.5–5.0)
Alkaline Phosphatase: 71 U/L (ref 38–126)
Anion gap: 9 (ref 5–15)
BUN: 22 mg/dL — ABNORMAL HIGH (ref 6–20)
CO2: 27 mmol/L (ref 22–32)
Calcium: 9.9 mg/dL (ref 8.9–10.3)
Chloride: 106 mmol/L (ref 98–111)
Creatinine: 1.08 mg/dL — ABNORMAL HIGH (ref 0.44–1.00)
GFR, Estimated: >60 mL/min (ref >60)
Glucose, Bld: 167 mg/dL — ABNORMAL HIGH (ref 70–99)
Potassium: 4.2 mmol/L (ref 3.5–5.1)
Sodium: 142 mmol/L (ref 135–145)
Total Bilirubin: 0.2 mg/dL — ABNORMAL LOW (ref 0.3–1.2)
Total Protein: 7.8 g/dL (ref 6.5–8.1)

## 2022-09-12 LAB — CEA (IN HOUSE-CHCC): CEA (CHCC-In House): 1.5 ng/mL (ref 0.00–5.00)

## 2022-09-12 LAB — FERRITIN: Ferritin: 13 ng/mL (ref 11–307)

## 2022-09-12 NOTE — Progress Notes (Signed)
[facility]   [phone] [phone]   Clinic Follow up Note   Patient Care Team: Nechama Guard, FNP as PCP - General (Family Medicine) Malachy Mood, MD as Consulting Physician (Oncology) Default, Provider, MD as Technician Default, Provider, MD as Technician  Date of Service:  09/12/2022  CHIEF COMPLAINT: f/u of Colorectal cancer    CURRENT THERAPY:  Cancer surveillance  ASSESSMENT:  [name] is a 47 y.o. female with   Colorectal cancer (HCC) Sigmoid colon cancer, stage IIIb, pT3 N1a M0, MMR normal -Diagnosed in November 2023, status post surgical resection on January 05, 2022 --I recommended adjuvant chemotherapy CapeOx, she started on 03/04/22 , due to poor tolerance, she declined more IV chemo after her first cycle.  We switched to Xeloda from cycle 2, she is tolerating well overall. -She was seen in the ED on Jun 08, 2022 for flank pain, CT of renal protocol was obtained which showed no stones, and no evidence of cancer recurrence.  I reviewed with patient. -She completed cycle 8 Xeloda in early August 2024 -Plan to repeat a CT chest, abdomen pelvis in November 2024. -She is clinically doing well, lab reviewed, exam was unremarkable, no clinical concern for recurrence.   PLAN: - reviewed labs - blood sugar elevated - f/u with PCP for blood sugars - lab and ct scan December - having colonoscopy    SUMMARY OF ONCOLOGIC HISTORY: Oncology History Overview Note   Cancer Staging  Colorectal cancer [facility]) Staging form: Colon and Rectum, AJCC 8th Edition - Clinical stage from 02/25/2022: Stage IIIB (cT3, cN1a, cM0) - Signed by Heilingoetter, Cassandra L, PA-C on 02/25/2022 Total positive nodes: 1 Histologic grade (G): G2 Histologic grading system: 4 grade system     Colorectal cancer (HCC)  12/12/2021 Procedure   Repeat colonoscopy: -Distal rectal exam is normal and no palpable  -Sigmoid colon: 2 proliferative circumferential  almost obstructing lesion.  Adult colonoscope could not pass through this lesion (biopsies taken).  Lesion is at 18 cm from the anal verge -Rigid sigmoidoscope the lesion is 18 cm from the anal verge.  Endoscopic diagnosis: Sigmoid mass almost obstructs the lumen suggestive of malignancy.    12/24/2021 Imaging   CT chest abdomen and pelvis  Conclusion: -1.  Known case of carcinoma of the sigmoid colon.  Segment of the sigmoid colon mural wall thickening with regional small lymph nodes. 2.  No suspicious distant metastatic lesion 3.  There is slight heterogeneous enhancement of the pancreatic head/uncanny process with some fatty changes and fat stranding likely postinflammatory changes, focal pancreatitis/groove pancreatitis   01/05/2022 Pathology Results   Operation: Laparoscopic anterior resection -Tumor size 5.5 x 5 x 1 cm -Histologic grade: G2, moderately differentiated -Tumor extent: Invades through the muscularis propria into pericolonic tissue -All margins are free of invasive carcinoma -Number of lymph nodes involved: 1 -Number of lymph nodes examined: 23  Pathologic staging: PT3 N1a      02/25/2022 Initial Diagnosis   Colorectal cancer (HCC)   02/25/2022 Cancer Staging   Staging form: Colon and Rectum, AJCC 8th Edition - Clinical stage from 02/25/2022: Stage IIIB (cT3, cN1a, cM0) - Signed by Heilingoetter, Cassandra L, PA-C on 02/25/2022 Total positive nodes: 1 Histologic grade (G): G2 Histologic grading system: 4 grade system   03/04/2022 - 03/04/2022 Chemotherapy   Patient is on Treatment Plan : COLORECTAL Xelox (Capeox)(130/850) q21d      Genetic Testing   Negative genetic testing. No pathogenic variants identified on the [facility]  Multi-Cancer+RNA panel. The report date is 06/12/2022.  The Multi-Cancer + RNA Panel offered by [facility] includes sequencing and/or deletion/duplication analysis of the following 70 genes:  AIP*, ALK, APC*, ATM*, AXIN2*, BAP1*, BARD1*, BLM*,  BMPR1A*, BRCA1*, BRCA2*, BRIP1*, CDC73*, CDH1*, CDK4, CDKN1B*, CDKN2A, CHEK2*, CTNNA1*, DICER1*, EPCAM, EGFR, FH*, FLCN*, GREM1, HOXB13, KIT, LZTR1, MAX*, MBD4, MEN1*, MET, MITF, MLH1*, MSH2*, MSH3*, MSH6*, MUTYH*, NF1*, NF2*, NTHL1*, PALB2*, PDGFRA, PMS2*, POLD1*, POLE*, POT1*, PRKAR1A*, PTCH1*, PTEN*, RAD51C*, RAD51D*, RB1*, RET, SDHA*, SDHAF2*, SDHB*, SDHC*, SDHD*, SMAD4*, SMARCA4*, SMARCB1*, SMARCE1*, STK11*, SUFU*, TMEM127*, TP53*, TSC1*, TSC2*, VHL*. RNA analysis is performed for * genes.      INTERVAL HISTORY:  [name] is here for a follow up of Colorectal cancer. She was last seen by me on 07/08/2022. She presents to clinic accompanied by daughter. Patient stated she is having no issues at this time. Patient reported she stopped taking the xeloda and her iron.      All other systems were reviewed with the patient and are negative.  MEDICAL HISTORY:  Past Medical History:  Diagnosis Date   Colorectal cancer (HCC)    Diabetes mellitus without complication (HCC)    Hyperlipemia     SURGICAL HISTORY: Past Surgical History:  Procedure Laterality Date   COLON SURGERY      I have reviewed the social history and family history with the patient and they are unchanged from previous note.  ALLERGIES:  has No Known Allergies.  MEDICATIONS:  Current Outpatient Medications  Medication Sig Dispense Refill   acyclovir ointment (ZOVIRAX) 5 % Apply 1 Application topically 3 (three) times daily. 30 g 0   capecitabine (XELODA) 500 MG tablet Take 3 tablets (1,500 mg total) by mouth 2 (two) times daily after a meal. Take within 30 minutes after meals. Take for 14 days, then off for 7 days. Repeat every 21 days. 84 tablet 2   methocarbamol (ROBAXIN) 500 MG tablet Take 1 tablet (500 mg total) by mouth 2 (two) times daily. 20 tablet 0   ondansetron (ZOFRAN) 8 MG tablet Take 1 tablet (8 mg total) by mouth every 8 (eight) hours as needed for nausea or vomiting. Starting 3 days after  chemotherapy 30 tablet 2   oxyCODONE-acetaminophen (PERCOCET/ROXICET) 5-325 MG tablet Take 1 tablet by mouth every 6 (six) hours as needed for severe pain. 15 tablet 0   prochlorperazine (COMPAZINE) 10 MG tablet Take 1 tablet (10 mg total) by mouth every 6 (six) hours as needed. 30 tablet 2   No current facility-administered medications for this visit.    PHYSICAL EXAMINATION: ECOG PERFORMANCE STATUS: 0 - Asymptomatic  Vitals:   09/12/22 1311  BP: (!) 126/90  Pulse: 96  Resp: 15  Temp: 97.8 F (36.6 C)  SpO2: 98%   Wt Readings from Last 3 Encounters:  09/12/22 216 lb 4.8 oz (98.1 kg)  07/08/22 215 lb 4.8 oz (97.7 kg)  06/08/22 209 lb (94.8 kg)     GENERAL:alert, no distress and comfortable SKIN: skin color, texture, turgor are normal, no rashes or significant lesions EYES: normal, Conjunctiva are pink and non-injected, sclera clear NECK: supple, thyroid normal size, non-tender, without nodularity LYMPH:  no palpable lymphadenopathy in the cervical, axillary  LUNGS: clear to auscultation and percussion with normal breathing effort HEART: regular rate & rhythm and no murmurs and no lower extremity edema ABDOMEN:abdomen soft, non-tender and normal bowel sounds Musculoskeletal:no cyanosis of digits and no clubbing  NEURO: alert & oriented x 3 with fluent speech, no focal motor/sensory  deficits  LABORATORY DATA:  I have reviewed the data as listed    Latest Ref Rng & Units 09/12/2022   12:24 PM 07/08/2022    9:34 AM 06/17/2022    8:50 AM  CBC  WBC 4.0 - 10.5 K/uL 6.4  5.3  5.9   Hemoglobin 12.0 - 15.0 g/dL 16.1  09.6  04.5   Hematocrit 36.0 - 46.0 % 41.3  40.1  40.2   Platelets 150 - 400 K/uL 542  532  579         Latest Ref Rng & Units 09/12/2022   12:24 PM 07/08/2022    9:34 AM 06/17/2022    8:50 AM  CMP  Glucose 70 - 99 mg/dL 409  811  914   BUN 6 - 20 mg/dL 22  24  26    Creatinine 0.44 - 1.00 mg/dL 7.82  9.56  2.13   Sodium 135 - 145 mmol/L 142  140  139    Potassium 3.5 - 5.1 mmol/L 4.2  4.3  4.3   Chloride 98 - 111 mmol/L 106  106  108   CO2 22 - 32 mmol/L 27  27  23    Calcium 8.9 - 10.3 mg/dL 9.9  08.6  9.7   Total Protein 6.5 - 8.1 g/dL 7.8  8.1  8.0   Total Bilirubin 0.3 - 1.2 mg/dL 0.2  0.3  0.3   Alkaline Phos 38 - 126 U/L 71  71  75   AST 15 - 41 U/L 12  15  12    ALT 0 - 44 U/L 11  12  9        RADIOGRAPHIC STUDIES: I have personally reviewed the radiological images as listed and agreed with the findings in the report. No results found.    Orders Placed This Encounter  Procedures   CT CHEST ABDOMEN PELVIS W CONTRAST    Standing Status:   Future    Standing Expiration Date:   09/12/2023    Order Specific Question:   If indicated for the ordered procedure, I authorize the administration of contrast media per Radiology protocol    Answer:   Yes    Order Specific Question:   Does the patient have a contrast media/X-ray dye allergy?    Answer:   No    Order Specific Question:   Preferred imaging location?    Answer:   [facility]    Order Specific Question:   If indicated for the ordered procedure, I authorize the administration of oral contrast media per Radiology protocol    Answer:   Yes    Order Specific Question:   Is patient pregnant?    Answer:   No   All questions were answered. The patient knows to call the clinic with any problems, questions or concerns. No barriers to learning was detected. The total time spent in the appointment was 25 minutes.     Malachy Mood, MD 09/12/2022   I, Sharlette Dense, CMA, am acting as scribe for Malachy Mood, MD.   I have reviewed the above documentation for accuracy and completeness, and I agree with the above.

## 2022-09-13 ENCOUNTER — Telehealth: Payer: Self-pay | Admitting: Hematology

## 2022-09-19 ENCOUNTER — Other Ambulatory Visit: Payer: Medicaid Other

## 2022-09-19 ENCOUNTER — Ambulatory Visit: Payer: Medicaid Other | Admitting: Hematology

## 2023-01-13 ENCOUNTER — Ambulatory Visit (HOSPITAL_COMMUNITY)
Admission: RE | Admit: 2023-01-13 | Discharge: 2023-01-13 | Disposition: A | Discharge status: Home / Self Care | Payer: Medicaid Other | Source: Ambulatory Visit | Attending: Hematology

## 2023-01-13 ENCOUNTER — Inpatient Hospital Stay: Payer: Medicaid Other | Attending: Physician Assistant

## 2023-01-13 DIAGNOSIS — C19 Malignant neoplasm of rectosigmoid junction: Secondary | ICD-10-CM | POA: Insufficient documentation

## 2023-01-13 DIAGNOSIS — M549 Dorsalgia, unspecified: Secondary | ICD-10-CM | POA: Insufficient documentation

## 2023-01-13 DIAGNOSIS — Z79624 Long term (current) use of inhibitors of nucleotide synthesis: Secondary | ICD-10-CM | POA: Diagnosis not present

## 2023-01-13 DIAGNOSIS — Z85048 Personal history of other malignant neoplasm of rectum, rectosigmoid junction, and anus: Secondary | ICD-10-CM | POA: Insufficient documentation

## 2023-01-13 DIAGNOSIS — D500 Iron deficiency anemia secondary to blood loss (chronic): Secondary | ICD-10-CM

## 2023-01-13 LAB — CBC WITH DIFFERENTIAL (CANCER CENTER ONLY)
Abs Immature Granulocytes: 0.02 10*3/uL (ref 0.00–0.07)
Basophils Absolute: 0.1 10*3/uL (ref 0.0–0.1)
Basophils Relative: 1 %
Eosinophils Absolute: 0.1 10*3/uL (ref 0.0–0.5)
Eosinophils Relative: 1 %
HCT: 41.9 % (ref 36.0–46.0)
Hemoglobin: 13.1 g/dL (ref 12.0–15.0)
Immature Granulocytes: 0 %
Lymphocytes Relative: 44 %
Lymphs Abs: 3 10*3/uL (ref 0.7–4.0)
MCH: 24.8 pg — ABNORMAL LOW (ref 26.0–34.0)
MCHC: 31.3 g/dL (ref 30.0–36.0)
MCV: 79.4 fL — ABNORMAL LOW (ref 80.0–100.0)
Monocytes Absolute: 0.4 10*3/uL (ref 0.1–1.0)
Monocytes Relative: 5 %
Neutro Abs: 3.3 10*3/uL (ref 1.7–7.7)
Neutrophils Relative %: 49 %
Platelet Count: 485 10*3/uL — ABNORMAL HIGH (ref 150–400)
RBC: 5.28 MIL/uL — ABNORMAL HIGH (ref 3.87–5.11)
RDW: 17.4 % — ABNORMAL HIGH (ref 11.5–15.5)
WBC Count: 6.8 10*3/uL (ref 4.0–10.5)
nRBC: 0 % (ref 0.0–0.2)

## 2023-01-13 LAB — FERRITIN: Ferritin: 20 ng/mL (ref 11–307)

## 2023-01-13 LAB — CMP (CANCER CENTER ONLY)
ALT: 14 U/L (ref 0–44)
AST: 15 U/L (ref 15–41)
Albumin: 4 g/dL (ref 3.5–5.0)
Alkaline Phosphatase: 71 U/L (ref 38–126)
Anion gap: 8 (ref 5–15)
BUN: 26 mg/dL — ABNORMAL HIGH (ref 6–20)
CO2: 26 mmol/L (ref 22–32)
Calcium: 9.9 mg/dL (ref 8.9–10.3)
Chloride: 106 mmol/L (ref 98–111)
Creatinine: 1.07 mg/dL — ABNORMAL HIGH (ref 0.44–1.00)
GFR, Estimated: >60 mL/min (ref >60)
Glucose, Bld: 219 mg/dL — ABNORMAL HIGH (ref 70–99)
Potassium: 3.8 mmol/L (ref 3.5–5.1)
Sodium: 140 mmol/L (ref 135–145)
Total Bilirubin: 0.3 mg/dL (ref <1.2)
Total Protein: 7.8 g/dL (ref 6.5–8.1)

## 2023-01-13 LAB — CEA (IN HOUSE-CHCC): CEA (CHCC-In House): 1.81 ng/mL (ref 0.00–5.00)

## 2023-01-13 MED ORDER — IOHEXOL 300 MG/ML  SOLN
100.0000 mL | Freq: Once | INTRAMUSCULAR | Status: AC | PRN
  Administered 2023-01-13: 100 mL via INTRAVENOUS

## 2023-01-13 MED ORDER — IOHEXOL 300 MG/ML  SOLN
30.0000 mL | Freq: Once | INTRAMUSCULAR | Status: AC | PRN
  Administered 2023-01-13: 30 mL via ORAL

## 2023-01-15 NOTE — Assessment & Plan Note (Signed)
Sigmoid colon cancer, stage IIIb, pT3 N1a M0, MMR normal -Diagnosed in November 2023, status post surgical resection on January 05, 2022 --I recommended adjuvant chemotherapy CapeOx, she started on 03/04/22 , due to poor tolerance, she declined more IV chemo after her first cycle.  We switched to Xeloda from cycle 2, she is tolerating well overall. -She was seen in the ED on Jun 08, 2022 for flank pain, CT of renal protocol was obtained which showed no stones, and no evidence of cancer recurrence.  I reviewed with patient. -She completed cycle 8 Xeloda in early August 2024 -surveillance CT 01/13/23 showed NED. Will repeat CT in 6 months, ctDNA [place] in 3 months.

## 2023-01-17 ENCOUNTER — Encounter: Payer: Self-pay | Admitting: Hematology

## 2023-01-17 ENCOUNTER — Inpatient Hospital Stay (HOSPITAL_BASED_OUTPATIENT_CLINIC_OR_DEPARTMENT_OTHER): Payer: Medicaid Other | Admitting: Hematology

## 2023-01-17 VITALS — BP 113/69 | HR 102 | Temp 97.3°F | Resp 16 | Wt 217.3 lb

## 2023-01-17 DIAGNOSIS — C19 Malignant neoplasm of rectosigmoid junction: Secondary | ICD-10-CM

## 2023-01-17 NOTE — Progress Notes (Signed)
[facility]   [phone] [phone]   Clinic Follow up Note   Patient Care Team: Nechama Guard, FNP as PCP - General (Family Medicine) Malachy Mood, MD as Consulting Physician (Oncology) Default, Provider, MD as Technician Default, Provider, MD as Technician  Date of Service:  01/17/2023  CHIEF COMPLAINT: f/u of colon cancer  CURRENT THERAPY:  Phalanx  Oncology History   Colorectal cancer (HCC) Sigmoid colon cancer, stage IIIb, pT3 N1a M0, MMR normal -Diagnosed in November 2023, status post surgical resection on January 05, 2022 --I recommended adjuvant chemotherapy CapeOx, she started on 03/04/22 , due to poor tolerance, she declined more IV chemo after her first cycle.  We switched to Xeloda from cycle 2, she is tolerating well overall. -She was seen in the ED on Jun 08, 2022 for flank pain, CT of renal protocol was obtained which showed no stones, and no evidence of cancer recurrence.  I reviewed with patient. -She completed cycle 8 Xeloda in early August 2024 -surveillance CT 01/13/23 showed NED. Will repeat CT in 6 months     Assessment and Plan    Colon Cancer Follow-up 47 year old with a history of colon cancer, status post resection and chemotherapy. Recent colonoscopy showed normal findings with two benign tubular adenomas removed, anastomosis site intact, and a small hemorrhoid noted. CT scan last week showed no recurrence, with an abnormal liver signal likely due to contrast. Labs: normal kidney and liver function, normal blood counts, slightly elevated platelet count, and elevated blood sugar at 219 likely due to pre-test eating. Tumor markers normal. Highest recurrence risk within first two years post-diagnosis, decreasing significantly after two years and more after three years. Emphasized importance of regular follow-ups and monitoring. - Repeat CT scan in six months if insurance approves - Follow-up every three months for labs and  exams for the next year - After two years, follow-up every four months with a scan - After three years, follow-up every six months with labs -I reviewed her outside colonoscopy from November 2024 which showed 2 polyps, removed, path showed tubular adenoma.  Her next colonoscopy is due in 3 years  Back Pain Acute back pain exacerbated by prolonged sitting. Previous back surgery. No current numbness or tingling. Discussed pain management options: physical therapy, heating pad, and topical Voltaren gel. Advised to consult primary care physician for initial evaluation and possible orthopedic referral. - Refer to primary care physician for initial evaluation - Consider referral to orthopedic surgeon if necessary - Recommend physical therapy - Advise use of heating pad - Recommend over-the-counter pain relief such as Advil or Aleve - Suggest use of Voltaren gel for pain relief  Plan -Lab and CT scan reviewed, NED -Lab and follow-up in 3 months     SUMMARY OF ONCOLOGIC HISTORY: Oncology History Overview Note   Cancer Staging  Colorectal cancer [facility]) Staging form: Colon and Rectum, AJCC 8th Edition - Clinical stage from 02/25/2022: Stage IIIB (cT3, cN1a, cM0) - Signed by Heilingoetter, Cassandra L, PA-C on 02/25/2022 Total positive nodes: 1 Histologic grade (G): G2 Histologic grading system: 4 grade system     Colorectal cancer (HCC)  12/12/2021 Procedure   Repeat colonoscopy: -Distal rectal exam is normal and no palpable  -Sigmoid colon: 2 proliferative circumferential almost obstructing lesion.  Adult colonoscope could not pass through this lesion (biopsies taken).  Lesion is at 18 cm from the anal verge -Rigid sigmoidoscope the lesion is 18 cm from the anal verge.  Endoscopic diagnosis: Sigmoid  mass almost obstructs the lumen suggestive of malignancy.    12/24/2021 Imaging   CT chest abdomen and pelvis  Conclusion: -1.  Known case of carcinoma of the sigmoid colon.  Segment of  the sigmoid colon mural wall thickening with regional small lymph nodes. 2.  No suspicious distant metastatic lesion 3.  There is slight heterogeneous enhancement of the pancreatic head/uncanny process with some fatty changes and fat stranding likely postinflammatory changes, focal pancreatitis/groove pancreatitis   01/05/2022 Pathology Results   Operation: Laparoscopic anterior resection -Tumor size 5.5 x 5 x 1 cm -Histologic grade: G2, moderately differentiated -Tumor extent: Invades through the muscularis propria into pericolonic tissue -All margins are free of invasive carcinoma -Number of lymph nodes involved: 1 -Number of lymph nodes examined: 23  Pathologic staging: PT3 N1a      02/25/2022 Initial Diagnosis   Colorectal cancer (HCC)   02/25/2022 Cancer Staging   Staging form: Colon and Rectum, AJCC 8th Edition - Clinical stage from 02/25/2022: Stage IIIB (cT3, cN1a, cM0) - Signed by Heilingoetter, Cassandra L, PA-C on 02/25/2022 Total positive nodes: 1 Histologic grade (G): G2 Histologic grading system: 4 grade system   03/04/2022 - 03/04/2022 Chemotherapy   Patient is on Treatment Plan : COLORECTAL Xelox (Capeox)(130/850) q21d      Genetic Testing   Negative genetic testing. No pathogenic variants identified on the [facility] Multi-Cancer+RNA panel. The report date is 06/12/2022.  The Multi-Cancer + RNA Panel offered by [facility] includes sequencing and/or deletion/duplication analysis of the following 70 genes:  AIP*, ALK, APC*, ATM*, AXIN2*, BAP1*, BARD1*, BLM*, BMPR1A*, BRCA1*, BRCA2*, BRIP1*, CDC73*, CDH1*, CDK4, CDKN1B*, CDKN2A, CHEK2*, CTNNA1*, DICER1*, EPCAM, EGFR, FH*, FLCN*, GREM1, HOXB13, KIT, LZTR1, MAX*, MBD4, MEN1*, MET, MITF, MLH1*, MSH2*, MSH3*, MSH6*, MUTYH*, NF1*, NF2*, NTHL1*, PALB2*, PDGFRA, PMS2*, POLD1*, POLE*, POT1*, PRKAR1A*, PTCH1*, PTEN*, RAD51C*, RAD51D*, RB1*, RET, SDHA*, SDHAF2*, SDHB*, SDHC*, SDHD*, SMAD4*, SMARCA4*, SMARCB1*, SMARCE1*, STK11*, SUFU*, TMEM127*,  TP53*, TSC1*, TSC2*, VHL*. RNA analysis is performed for * genes.      Discussed the use of AI scribe software for clinical note transcription with the patient, who gave verbal consent to proceed.  History of Present Illness   The patient, a 47 year old female with a history of colon cancer, presents for a follow-up visit. She recently underwent a colonoscopy, which revealed two polyps that were removed. The anastomosis site appears healthy. She also had a CT scan, which showed no signs of recurrence. However, an abnormal signal was detected in the liver, thought to be due to contrast. The patient's lab work, including kidney and liver function, is normal, although her sugar levels were slightly elevated. She also has a disc problem in her back, which causes her pain, particularly when sitting for long periods. She had surgery for this issue in the past.         All other systems were reviewed with the patient and are negative.  MEDICAL HISTORY:  Past Medical History:  Diagnosis Date   Colorectal cancer (HCC)    Diabetes mellitus without complication (HCC)    Hyperlipemia     SURGICAL HISTORY: Past Surgical History:  Procedure Laterality Date   COLON SURGERY      I have reviewed the social history and family history with the patient and they are unchanged from previous note.  ALLERGIES:  has no known allergies.  MEDICATIONS:  Current Outpatient Medications  Medication Sig Dispense Refill   acyclovir ointment (ZOVIRAX) 5 % Apply 1 Application topically 3 (three) times daily. 30 g  0   capecitabine (XELODA) 500 MG tablet Take 3 tablets (1,500 mg total) by mouth 2 (two) times daily after a meal. Take within 30 minutes after meals. Take for 14 days, then off for 7 days. Repeat every 21 days. 84 tablet 2   methocarbamol (ROBAXIN) 500 MG tablet Take 1 tablet (500 mg total) by mouth 2 (two) times daily. 20 tablet 0   ondansetron (ZOFRAN) 8 MG tablet Take 1 tablet (8 mg total) by mouth  every 8 (eight) hours as needed for nausea or vomiting. Starting 3 days after chemotherapy 30 tablet 2   oxyCODONE-acetaminophen (PERCOCET/ROXICET) 5-325 MG tablet Take 1 tablet by mouth every 6 (six) hours as needed for severe pain. 15 tablet 0   prochlorperazine (COMPAZINE) 10 MG tablet Take 1 tablet (10 mg total) by mouth every 6 (six) hours as needed. 30 tablet 2   No current facility-administered medications for this visit.    PHYSICAL EXAMINATION: ECOG PERFORMANCE STATUS: 0 - Asymptomatic  Vitals:   01/17/23 0942  BP: 113/69  Pulse: (!) 102  Resp: 16  Temp: (!) 97.3 F (36.3 C)  SpO2: 100%   Wt Readings from Last 3 Encounters:  01/17/23 217 lb 4.8 oz (98.6 kg)  09/12/22 216 lb 4.8 oz (98.1 kg)  07/08/22 215 lb 4.8 oz (97.7 kg)     GENERAL:alert, no distress and comfortable SKIN: skin color, texture, turgor are normal, no rashes or significant lesions EYES: normal, Conjunctiva are pink and non-injected, sclera clear Musculoskeletal:no cyanosis of digits and no clubbing  NEURO: alert & oriented x 3 with fluent speech, no focal motor/sensory deficits    LABORATORY DATA:  I have reviewed the data as listed    Latest Ref Rng & Units 01/13/2023    9:24 AM 09/12/2022   12:24 PM 07/08/2022    9:34 AM  CBC  WBC 4.0 - 10.5 K/uL 6.8  6.4  5.3   Hemoglobin 12.0 - 15.0 g/dL 91.4  78.2  95.6   Hematocrit 36.0 - 46.0 % 41.9  41.3  40.1   Platelets 150 - 400 K/uL 485  542  532         Latest Ref Rng & Units 01/13/2023    9:24 AM 09/12/2022   12:24 PM 07/08/2022    9:34 AM  CMP  Glucose 70 - 99 mg/dL 213  086  578   BUN 6 - 20 mg/dL 26  22  24    Creatinine 0.44 - 1.00 mg/dL 4.69  6.29  5.28   Sodium 135 - 145 mmol/L 140  142  140   Potassium 3.5 - 5.1 mmol/L 3.8  4.2  4.3   Chloride 98 - 111 mmol/L 106  106  106   CO2 22 - 32 mmol/L 26  27  27    Calcium 8.9 - 10.3 mg/dL 9.9  9.9  41.3   Total Protein 6.5 - 8.1 g/dL 7.8  7.8  8.1   Total Bilirubin <1.2 mg/dL 0.3  0.2  0.3    Alkaline Phos 38 - 126 U/L 71  71  71   AST 15 - 41 U/L 15  12  15    ALT 0 - 44 U/L 14  11  12        RADIOGRAPHIC STUDIES: I have personally reviewed the radiological images as listed and agreed with the findings in the report. No results found.    No orders of the defined types were placed in this encounter.  All questions were  answered. The patient knows to call the clinic with any problems, questions or concerns. No barriers to learning was detected. The total time spent in the appointment was 25 minutes.     Malachy Mood, MD 01/17/2023

## 2023-01-18 ENCOUNTER — Other Ambulatory Visit: Payer: Medicaid Other

## 2023-01-23 ENCOUNTER — Ambulatory Visit: Payer: Medicaid Other | Admitting: Hematology

## 2023-04-13 ENCOUNTER — Telehealth: Payer: Self-pay | Admitting: Hematology

## 2023-04-13 NOTE — Telephone Encounter (Signed)
 Received patient's voicemail on 04/13/2023; Left patient a message in regards to confirming cancelled appointment for 04/18/2023 per patient's request in voicemail, left callback number for scheduling line if patient wishes to reschedule appointment

## 2023-04-18 ENCOUNTER — Ambulatory Visit: Payer: Medicaid Other | Admitting: Hematology

## 2023-04-18 ENCOUNTER — Other Ambulatory Visit: Payer: Medicaid Other

## 2023-06-22 ENCOUNTER — Encounter (HOSPITAL_COMMUNITY): Payer: Self-pay

## 2023-06-22 ENCOUNTER — Ambulatory Visit (HOSPITAL_COMMUNITY): Admission: EM | Admit: 2023-06-22 | Discharge: 2023-06-22 | Disposition: A | Discharge status: Home / Self Care

## 2023-06-22 DIAGNOSIS — E1169 Type 2 diabetes mellitus with other specified complication: Secondary | ICD-10-CM

## 2023-06-22 DIAGNOSIS — J40 Bronchitis, not specified as acute or chronic: Secondary | ICD-10-CM | POA: Diagnosis not present

## 2023-06-22 DIAGNOSIS — L309 Dermatitis, unspecified: Secondary | ICD-10-CM

## 2023-06-22 DIAGNOSIS — J329 Chronic sinusitis, unspecified: Secondary | ICD-10-CM

## 2023-06-22 MED ORDER — AMOXICILLIN-POT CLAVULANATE 875-125 MG PO TABS: 1.0000 | ORAL_TABLET | Freq: Two times a day (BID) | ORAL | 0 refills | Status: AC

## 2023-06-22 MED ORDER — PREDNISONE 20 MG PO TABS: 20.0000 mg | ORAL_TABLET | Freq: Every day | ORAL | 0 refills | Status: AC

## 2023-06-22 NOTE — ED Triage Notes (Signed)
 Chief Complaint: cough, rash, sore throat. Declines runny nose or fever. Rash is located on the right forearm.   Sick exposure: No  Onset: 3 week ago  Prescriptions or OTC medications tried: Yes- rash cream and otc cough meds   with no relief  New foods, medications, or products: No  Recent Travel: 12 days ago came fro [place].

## 2023-06-22 NOTE — ED Provider Notes (Signed)
 [facility]    CSN: [id number] Arrival date & time: 06/22/23  1019      History   Chief Complaint Chief Complaint  Patient presents with   Cough    HPI [name] is a 48 y.o. female.   Patient presents today for evaluation of cough, sore throat, nasal congestion and pressure that has been present for the last 3 weeks.  She reports she has had some rash as well to her right forearm.  She denies any fever.  She has not had any vomiting or diarrhea.  She has tried topical treatment for her rash as well as over-the-counter cough medication without significant improvement.  She denies any recent travel.  The history is provided by the patient. The history is limited by a language barrier. A language interpreter was used (patient's sister at patient's request).  Cough Associated symptoms: rash and sore throat   Associated symptoms: no chills, no ear pain, no eye discharge, no fever, no shortness of breath and no wheezing     Past Medical History:  Diagnosis Date   Colorectal cancer (HCC)    Diabetes mellitus without complication (HCC)    Hyperlipemia     Patient Active Problem List   Diagnosis Date Noted   Genetic testing 06/14/2022   Yeast infection 05/06/2022   Colorectal cancer (HCC) 02/25/2022   Encounter for antineoplastic chemotherapy 02/25/2022    Past Surgical History:  Procedure Laterality Date   COLON SURGERY      OB History   No obstetric history on file.      Home Medications    Prior to Admission medications   Medication Sig Start Date End Date Taking? Authorizing Provider  amoxicillin-clavulanate (AUGMENTIN) 875-125 MG tablet Take 1 tablet by mouth every 12 (twelve) hours. 06/22/23  Yes Vernestine Gondola, PA-C  dapagliflozin propanediol (FARXIGA) 10 MG TABS tablet Take 10 mg by mouth. 10/06/22  Yes [provider]  empagliflozin (JARDIANCE) 25 MG TABS tablet Take 25 mg by mouth. 04/11/22  Yes [provider]  losartan  (COZAAR) 50 MG tablet Take 1 tablet by mouth 2 (two) times daily. 11/15/22  Yes [provider]  metFORMIN (GLUCOPHAGE) 1000 MG tablet Take 0.5 tablets by mouth daily. 11/15/22  Yes [provider]  omeprazole (PRILOSEC) 20 MG capsule Take 20 mg by mouth. 09/14/22 09/14/23 Yes [provider]  predniSONE (DELTASONE) 20 MG tablet Take 1 tablet (20 mg total) by mouth daily with breakfast for 5 days. 06/22/23 06/27/23 Yes Vernestine Gondola, PA-C  atorvastatin (LIPITOR) 10 MG tablet Take 10 mg by mouth at bedtime.   Yes [provider]  capecitabine  (XELODA ) 500 MG tablet Take 3 tablets (1,500 mg total) by mouth 2 (two) times daily after a meal. Take within 30 minutes after meals. Take for 14 days, then off for 7 days. Repeat every 21 days. 05/27/22  Yes Sonja Burgoon, MD    Family History History reviewed. No pertinent family history.  Social History Social History   Tobacco Use   Smoking status: Never   Smokeless tobacco: Never  Substance Use Topics   Alcohol use: Not Currently   Drug use: Not Currently     Allergies   Patient has no known allergies.   Review of Systems Review of Systems  Constitutional:  Negative for chills and fever.  HENT:  Positive for congestion, sinus pressure and sore throat. Negative for ear pain.   Eyes:  Negative for discharge and redness.  Respiratory:  Positive for cough. Negative for shortness of breath and wheezing.   Gastrointestinal:  Negative for abdominal pain, diarrhea, nausea and vomiting.  Skin:  Positive for rash.     Physical Exam Triage Vital Signs ED Triage Vitals  Encounter Vitals Group     BP 06/22/23 1134 110/77     Systolic BP Percentile --      Diastolic BP Percentile --      Pulse Rate 06/22/23 1134 95     Resp 06/22/23 1134 18     Temp 06/22/23 1134 98.1 F (36.7 C)     Temp Source 06/22/23 1134 Oral     SpO2 06/22/23 1134 96 %     Weight --      Height --      Head Circumference --       Peak Flow --      Pain Score 06/22/23 1130 0     Pain Loc --      Pain Education --      Exclude from Growth Chart --    No data found.  Updated Vital Signs BP 110/77 (BP Location: Left Arm)   Pulse 95   Temp 98.1 F (36.7 C) (Oral)   Resp 18   SpO2 96%   Visual Acuity Right Eye Distance:   Left Eye Distance:   Bilateral Distance:    Right Eye Near:   Left Eye Near:    Bilateral Near:     Physical Exam Vitals and nursing note reviewed.  Constitutional:      General: She is not in acute distress.    Appearance: Normal appearance. She is not ill-appearing.  HENT:     Head: Normocephalic and atraumatic.     Nose: Congestion present.     Mouth/Throat:     Mouth: Mucous membranes are moist.     Pharynx: No oropharyngeal exudate or posterior oropharyngeal erythema.  Eyes:     Conjunctiva/sclera: Conjunctivae normal.  Cardiovascular:     Rate and Rhythm: Normal rate and regular rhythm.  Pulmonary:     Effort: Pulmonary effort is normal. No respiratory distress.     Breath sounds: Normal breath sounds. No wheezing, rhonchi or rales.  Skin:    General: Skin is warm and dry.     Findings: Rash (few clustered mildly erythematous papules with signs of excoriation to right forearm, One single similar appearing papule to central abdomen) present.  Neurological:     Mental Status: She is alert.  Psychiatric:        Mood and Affect: Mood normal.        Thought Content: Thought content normal.      UC Treatments / Results  Labs (all labs ordered are listed, but only abnormal results are displayed) Labs Reviewed - No data to display  EKG   Radiology No results found.  Procedures Procedures (including critical care time)  Medications Ordered in UC Medications - No data to display  Initial Impression / Assessment and Plan / UC Course  I have reviewed the triage vital signs and the nursing notes.  Pertinent labs & imaging results that were available during my  care of the patient were reviewed by me and considered in my medical decision making (see chart for details).    Will treat to cover sinobronchitis with steroid burst and Augmentin.  Patient is a diabetic and reports her last A1c was 8.  Will proceed with steroid burst at lower dose due to this and recommended  she monitor her glucose as steroids may elevate glucose levels.  Discussed that steroid would also likely improve dermatitis but encouraged follow-up if any symptoms do not improve or worsen in any way.  Final Clinical Impressions(s) / UC Diagnoses   Final diagnoses:  Sinobronchitis  Dermatitis  Type 2 diabetes mellitus with other specified complication, without long-term current use of insulin [facility])   Discharge Instructions   None    ED Prescriptions     Medication Sig Dispense Auth. Provider   predniSONE (DELTASONE) 20 MG tablet Take 1 tablet (20 mg total) by mouth daily with breakfast for 5 days. 5 tablet Vernestine Gondola, PA-C   amoxicillin-clavulanate (AUGMENTIN) 875-125 MG tablet Take 1 tablet by mouth every 12 (twelve) hours. 14 tablet Vernestine Gondola, PA-C      PDMP not reviewed this encounter.   Vernestine Gondola, PA-C 06/22/23 1251

## 2023-06-27 ENCOUNTER — Encounter (HOSPITAL_COMMUNITY): Payer: Self-pay

## 2023-06-27 ENCOUNTER — Telehealth: Payer: Self-pay | Admitting: Hematology

## 2023-06-27 ENCOUNTER — Other Ambulatory Visit: Payer: Self-pay

## 2023-06-27 ENCOUNTER — Emergency Department (HOSPITAL_COMMUNITY)
Admission: EM | Admit: 2023-06-27 | Discharge: 2023-06-27 | Disposition: A | Discharge status: Home / Self Care | Attending: Emergency Medicine | Admitting: Emergency Medicine

## 2023-06-27 DIAGNOSIS — R062 Wheezing: Secondary | ICD-10-CM | POA: Insufficient documentation

## 2023-06-27 DIAGNOSIS — Z7984 Long term (current) use of oral hypoglycemic drugs: Secondary | ICD-10-CM | POA: Insufficient documentation

## 2023-06-27 DIAGNOSIS — R300 Dysuria: Secondary | ICD-10-CM | POA: Diagnosis not present

## 2023-06-27 DIAGNOSIS — J988 Other specified respiratory disorders: Secondary | ICD-10-CM

## 2023-06-27 LAB — COMPREHENSIVE METABOLIC PANEL WITH GFR
ALT: 15 U/L (ref 0–44)
AST: 17 U/L (ref 15–41)
Albumin: 3.5 g/dL (ref 3.5–5.0)
Alkaline Phosphatase: 90 U/L (ref 38–126)
Anion gap: 12 (ref 5–15)
BUN: 30 mg/dL — ABNORMAL HIGH (ref 6–20)
CO2: 19 mmol/L — ABNORMAL LOW (ref 22–32)
Calcium: 9.4 mg/dL (ref 8.9–10.3)
Chloride: 108 mmol/L (ref 98–111)
Creatinine, Ser: 1.06 mg/dL — ABNORMAL HIGH (ref 0.44–1.00)
GFR, Estimated: >60 mL/min (ref >60)
Glucose, Bld: 194 mg/dL — ABNORMAL HIGH (ref 70–99)
Potassium: 3.9 mmol/L (ref 3.5–5.1)
Sodium: 139 mmol/L (ref 135–145)
Total Bilirubin: 0.3 mg/dL (ref 0.0–1.2)
Total Protein: 7.9 g/dL (ref 6.5–8.1)

## 2023-06-27 LAB — URINALYSIS, ROUTINE W REFLEX MICROSCOPIC
Bilirubin Urine: NEGATIVE
Glucose, UA: >=500 mg/dL — AB
Hgb urine dipstick: NEGATIVE
Ketones, ur: NEGATIVE mg/dL
Leukocytes,Ua: NEGATIVE
Nitrite: NEGATIVE
Protein, ur: NEGATIVE mg/dL
Specific Gravity, Urine: 1.022 (ref 1.005–1.030)
pH: 5 (ref 5.0–8.0)

## 2023-06-27 LAB — CBC WITH DIFFERENTIAL/PLATELET
Abs Immature Granulocytes: 0.02 10*3/uL (ref 0.00–0.07)
Basophils Absolute: 0 10*3/uL (ref 0.0–0.1)
Basophils Relative: 0 %
Eosinophils Absolute: 0.1 10*3/uL (ref 0.0–0.5)
Eosinophils Relative: 1 %
HCT: 45.1 % (ref 36.0–46.0)
Hemoglobin: 13.9 g/dL (ref 12.0–15.0)
Immature Granulocytes: 0 %
Lymphocytes Relative: 33 %
Lymphs Abs: 2.2 10*3/uL (ref 0.7–4.0)
MCH: 25.5 pg — ABNORMAL LOW (ref 26.0–34.0)
MCHC: 30.8 g/dL (ref 30.0–36.0)
MCV: 82.8 fL (ref 80.0–100.0)
Monocytes Absolute: 0.3 10*3/uL (ref 0.1–1.0)
Monocytes Relative: 5 %
Neutro Abs: 4.1 10*3/uL (ref 1.7–7.7)
Neutrophils Relative %: 61 %
Platelets: 523 10*3/uL — ABNORMAL HIGH (ref 150–400)
RBC: 5.45 MIL/uL — ABNORMAL HIGH (ref 3.87–5.11)
RDW: 15.8 % — ABNORMAL HIGH (ref 11.5–15.5)
WBC: 6.7 10*3/uL (ref 4.0–10.5)
nRBC: 0 % (ref 0.0–0.2)

## 2023-06-27 MED ORDER — CEFDINIR 300 MG PO CAPS: 300.0000 mg | ORAL_CAPSULE | Freq: Two times a day (BID) | ORAL | 0 refills | Status: AC

## 2023-06-27 NOTE — ED Provider Notes (Signed)
 [place] EMERGENCY DEPARTMENT AT [facility] Provider Note   CSN: [id number] Arrival date & time: 06/27/23  1112     History  Chief Complaint  Patient presents with   Dysuria    [name] is a 48 y.o. female.  48 yo F with a chief complaints of cough congestion difficulty breathing dysuria.  Going on for about a week.  She went to urgent care was started on antibiotics for possible sinus infection.  She did not like the way that the antibiotic made her feel.  Gave her some indigestion.  Stoped the antibiotic and decided to come into the ED for evaluation.  A language interpreter was used.  Dysuria      Home Medications Prior to Admission medications   Medication Sig Start Date End Date Taking? Authorizing Provider  cefdinir (OMNICEF) 300 MG capsule Take 1 capsule (300 mg total) by mouth 2 (two) times daily for 7 days. 06/27/23 07/04/23 Yes Albertus Hughs, DO  amoxicillin-clavulanate (AUGMENTIN) 875-125 MG tablet Take 1 tablet by mouth every 12 (twelve) hours. 06/22/23   Vernestine Gondola, PA-C  atorvastatin (LIPITOR) 10 MG tablet Take 10 mg by mouth at bedtime.    [provider]  capecitabine  (XELODA ) 500 MG tablet Take 3 tablets (1,500 mg total) by mouth 2 (two) times daily after a meal. Take within 30 minutes after meals. Take for 14 days, then off for 7 days. Repeat every 21 days. 05/27/22   Sonja Pharr, MD  dapagliflozin propanediol (FARXIGA) 10 MG TABS tablet Take 10 mg by mouth. 10/06/22   [provider]  empagliflozin (JARDIANCE) 25 MG TABS tablet Take 25 mg by mouth. 04/11/22   [provider]  losartan (COZAAR) 50 MG tablet Take 1 tablet by mouth 2 (two) times daily. 11/15/22   [provider]  metFORMIN (GLUCOPHAGE) 1000 MG tablet Take 0.5 tablets by mouth daily. 11/15/22   [provider]  omeprazole (PRILOSEC) 20 MG capsule Take 20 mg by mouth. 09/14/22 09/14/23  [provider]  predniSONE (DELTASONE) 20 MG  tablet Take 1 tablet (20 mg total) by mouth daily with breakfast for 5 days. 06/22/23 06/27/23  Vernestine Gondola, PA-C      Allergies    Patient has no known allergies.    Review of Systems   Review of Systems  Genitourinary:  Positive for dysuria.    Physical Exam Updated Vital Signs BP 125/78 (BP Location: Left Arm)   Pulse 100   Temp 98.1 F (36.7 C) (Oral)   Resp 18   SpO2 100%  Physical Exam Vitals and nursing note reviewed.  Constitutional:      General: She is not in acute distress.    Appearance: She is well-developed. She is not diaphoretic.  HENT:     Head: Normocephalic and atraumatic.  Eyes:     Pupils: Pupils are equal, round, and reactive to light.  Cardiovascular:     Rate and Rhythm: Normal rate and regular rhythm.     Heart sounds: No murmur heard.    No friction rub. No gallop.  Pulmonary:     Effort: Pulmonary effort is normal.     Breath sounds: No wheezing or rales.  Abdominal:     General: There is no distension.     Palpations: Abdomen is soft.     Tenderness: There is no abdominal tenderness.  Musculoskeletal:        General: No tenderness.     Cervical back: Normal  range of motion and neck supple.  Skin:    General: Skin is warm and dry.  Neurological:     Mental Status: She is alert and oriented to person, place, and time.  Psychiatric:        Behavior: Behavior normal.     ED Results / Procedures / Treatments   Labs (all labs ordered are listed, but only abnormal results are displayed) Labs Reviewed  URINALYSIS, ROUTINE W REFLEX MICROSCOPIC - Abnormal; Notable for the following components:      Result Value   APPearance HAZY (*)    Glucose, UA >=500 (*)    Bacteria, UA RARE (*)    All other components within normal limits  COMPREHENSIVE METABOLIC PANEL WITH GFR - Abnormal; Notable for the following components:   CO2 19 (*)    Glucose, Bld 194 (*)    BUN 30 (*)    Creatinine, Ser 1.06 (*)    All other components within normal  limits  CBC WITH DIFFERENTIAL/PLATELET - Abnormal; Notable for the following components:   RBC 5.45 (*)    MCH 25.5 (*)    RDW 15.8 (*)    Platelets 523 (*)    All other components within normal limits  CBC WITH DIFFERENTIAL/PLATELET    EKG None  Radiology No results found.  Procedures Procedures    Medications Ordered in ED Medications - No data to display  ED Course/ Medical Decision Making/ A&P                                 Medical Decision Making Amount and/or Complexity of Data Reviewed Labs: ordered.  Risk Prescription drug management.   48 yo F with a chief complaint of cough congestion dysuria going on for about a week.  She is having some pain with coughing.  She was started on Augmentin and unfortunately had developed some GI symptoms with it.  Stopped taking it but was still feeling sick and so decided to come in for evaluation.  Patient's UA without obvious infection.  Regardless I will change her to cefdinir which would cover both sinus and urine.  I think most likely the patient is a viral syndrome.  No leukocytosis no significant electrolyte abnormalities.  Discharge home.  PCP follow-up.  2:25 PM:  I have discussed the diagnosis/risks/treatment options with the patient and family.  Evaluation and diagnostic testing in the emergency department does not suggest an emergent condition requiring admission or immediate intervention beyond what has been performed at this time.  They will follow up with PCP. We also discussed returning to the ED immediately if new or worsening sx occur. We discussed the sx which are most concerning (e.g., sudden worsening pain, fever, inability to tolerate by mouth) that necessitate immediate return. Medications administered to the patient during their visit and any new prescriptions provided to the patient are listed below.  Medications given during this visit Medications - No data to display   The patient appears reasonably  screen and/or stabilized for discharge and I doubt any other medical condition or other [facility] requiring further screening, evaluation, or treatment in the ED at this time prior to discharge.          Final Clinical Impression(s) / ED Diagnoses Final diagnoses:  Wheezing-associated respiratory infection (WARI)    Rx / DC Orders ED Discharge Orders          Ordered  cefdinir (OMNICEF) 300 MG capsule  2 times daily        06/27/23 1232              Albertus Hughs, DO 06/27/23 1425

## 2023-06-27 NOTE — Discharge Instructions (Signed)
Follow up with your family doc. Return for worsening symptoms.  °

## 2023-06-27 NOTE — ED Triage Notes (Signed)
 Pt states she had a recent UTI and was taking antibiotics for it but stopped because it was giving her heartburn. Now pt states her back is hurting and still having painful urination.

## 2023-07-04 ENCOUNTER — Other Ambulatory Visit: Payer: Self-pay

## 2023-07-04 DIAGNOSIS — D500 Iron deficiency anemia secondary to blood loss (chronic): Secondary | ICD-10-CM

## 2023-07-04 DIAGNOSIS — C187 Malignant neoplasm of sigmoid colon: Secondary | ICD-10-CM

## 2023-07-04 DIAGNOSIS — C19 Malignant neoplasm of rectosigmoid junction: Secondary | ICD-10-CM

## 2023-07-05 ENCOUNTER — Encounter: Payer: Self-pay | Admitting: Hematology

## 2023-07-05 ENCOUNTER — Inpatient Hospital Stay (HOSPITAL_BASED_OUTPATIENT_CLINIC_OR_DEPARTMENT_OTHER): Admitting: Hematology

## 2023-07-05 ENCOUNTER — Inpatient Hospital Stay: Attending: Hematology

## 2023-07-05 VITALS — BP 104/80 | HR 94 | Temp 97.2°F | Resp 14 | Wt 219.6 lb

## 2023-07-05 DIAGNOSIS — C19 Malignant neoplasm of rectosigmoid junction: Secondary | ICD-10-CM

## 2023-07-05 DIAGNOSIS — Z9221 Personal history of antineoplastic chemotherapy: Secondary | ICD-10-CM | POA: Insufficient documentation

## 2023-07-05 DIAGNOSIS — Z85048 Personal history of other malignant neoplasm of rectum, rectosigmoid junction, and anus: Secondary | ICD-10-CM | POA: Diagnosis present

## 2023-07-05 DIAGNOSIS — C187 Malignant neoplasm of sigmoid colon: Secondary | ICD-10-CM

## 2023-07-05 DIAGNOSIS — D500 Iron deficiency anemia secondary to blood loss (chronic): Secondary | ICD-10-CM

## 2023-07-05 LAB — CMP (CANCER CENTER ONLY)
ALT: 16 U/L (ref 0–44)
AST: 16 U/L (ref 15–41)
Albumin: 4.1 g/dL (ref 3.5–5.0)
Alkaline Phosphatase: 81 U/L (ref 38–126)
Anion gap: 8 (ref 5–15)
BUN: 23 mg/dL — ABNORMAL HIGH (ref 6–20)
CO2: 26 mmol/L (ref 22–32)
Calcium: 10 mg/dL (ref 8.9–10.3)
Chloride: 103 mmol/L (ref 98–111)
Creatinine: 1.19 mg/dL — ABNORMAL HIGH (ref 0.44–1.00)
GFR, Estimated: 56 mL/min — ABNORMAL LOW (ref >60)
Glucose, Bld: 123 mg/dL — ABNORMAL HIGH (ref 70–99)
Potassium: 4.1 mmol/L (ref 3.5–5.1)
Sodium: 137 mmol/L (ref 135–145)
Total Bilirubin: 0.2 mg/dL (ref 0.0–1.2)
Total Protein: 8.2 g/dL — ABNORMAL HIGH (ref 6.5–8.1)

## 2023-07-05 LAB — CBC WITH DIFFERENTIAL (CANCER CENTER ONLY)
Abs Immature Granulocytes: 0.02 10*3/uL (ref 0.00–0.07)
Basophils Absolute: 0.1 10*3/uL (ref 0.0–0.1)
Basophils Relative: 1 %
Eosinophils Absolute: 0.1 10*3/uL (ref 0.0–0.5)
Eosinophils Relative: 1 %
HCT: 42.5 % (ref 36.0–46.0)
Hemoglobin: 13.7 g/dL (ref 12.0–15.0)
Immature Granulocytes: 0 %
Lymphocytes Relative: 41 %
Lymphs Abs: 2.7 10*3/uL (ref 0.7–4.0)
MCH: 25.8 pg — ABNORMAL LOW (ref 26.0–34.0)
MCHC: 32.2 g/dL (ref 30.0–36.0)
MCV: 79.9 fL — ABNORMAL LOW (ref 80.0–100.0)
Monocytes Absolute: 0.3 10*3/uL (ref 0.1–1.0)
Monocytes Relative: 5 %
Neutro Abs: 3.4 10*3/uL (ref 1.7–7.7)
Neutrophils Relative %: 52 %
Platelet Count: 487 10*3/uL — ABNORMAL HIGH (ref 150–400)
RBC: 5.32 MIL/uL — ABNORMAL HIGH (ref 3.87–5.11)
RDW: 15.7 % — ABNORMAL HIGH (ref 11.5–15.5)
WBC Count: 6.5 10*3/uL (ref 4.0–10.5)
nRBC: 0 % (ref 0.0–0.2)

## 2023-07-05 NOTE — Progress Notes (Signed)
 [facility]   Telephone:(336) [phone] Fax:(336) [phone]   Clinic Follow up Note   Patient Care Team: Elige Gruber, FNP as PCP - General (Family Medicine) Sonja Orleans, MD as Consulting Physician (Oncology) Default, Provider, MD as Technician Default, Provider, MD as Technician  Date of Service:  07/05/2023  CHIEF COMPLAINT: f/u of colon cancer   CURRENT THERAPY:  Cancer surveillance   Oncology History   Colorectal cancer [facility]) Sigmoid colon cancer, stage IIIb, pT3 N1a M0, MMR normal -Diagnosed in November 2023, status post surgical resection on January 05, 2022 --I recommended adjuvant chemotherapy CapeOx, she started on 03/04/22 , due to poor tolerance, she declined more IV chemo after her first cycle.  We switched to Xeloda  from cycle 2, she is tolerating well overall. -She was seen in the ED on Jun 08, 2022 for flank pain, CT of renal protocol was obtained which showed no stones, and no evidence of cancer recurrence.  I reviewed with patient. -She completed cycle 8 Xeloda  in early August 2024 -surveillance CT 01/13/23 showed NED. Will repeat CT in 6 months, ctDNA Signatera in 3 months.   Assessment & Plan Colon cancer Post-surgical resection follow-up for colon cancer. Previous colonoscopy in August 2024 revealed two polyps, which were excised. She is currently asymptomatic with normal bowel movements and no abdominal pain or nausea. Blood counts are stable with a slightly elevated platelet count. CMP, kidney, and liver function tests are pending. Discussed the option of circulating tumor DNA testing for early cancer recurrence detection, with confirmed insurance coverage. - Order CT scan of chest, abdomen, and pelvis with contrast to be done [place] - Perform circulating tumor DNA test (Guardian review) on Friday before travel - Review CMP, kidney, and liver function tests when available - Schedule follow-up appointment in three months  Thrombocytosis   Platelet count is slightly elevated but improved from previous measurements. Likely reactive. No immediate intervention required.  Plan - Patient is due for surveillance CT scan.  She is leaving the country next week, I recommend CT chest, abdomen pelvis with contrast at her home country - Will obtain ctDNA Signatera later this week, and continue every 3 months -f/u in 3 months      SUMMARY OF ONCOLOGIC HISTORY: Oncology History Overview Note   Cancer Staging  Colorectal cancer [facility]) Staging form: Colon and Rectum, AJCC 8th Edition - Clinical stage from 02/25/2022: Stage IIIB (cT3, cN1a, cM0) - Signed by Heilingoetter, Cassandra L, PA-C on 02/25/2022 Total positive nodes: 1 Histologic grade (G): G2 Histologic grading system: 4 grade system     Colorectal cancer (HCC)  12/12/2021 Procedure   Repeat colonoscopy: -Distal rectal exam is normal and no palpable  -Sigmoid colon: 2 proliferative circumferential almost obstructing lesion.  Adult colonoscope could not pass through this lesion (biopsies taken).  Lesion is at 18 cm from the anal verge -Rigid sigmoidoscope the lesion is 18 cm from the anal verge.  Endoscopic diagnosis: Sigmoid mass almost obstructs the lumen suggestive of malignancy.    12/24/2021 Imaging   CT chest abdomen and pelvis  Conclusion: -1.  Known case of carcinoma of the sigmoid colon.  Segment of the sigmoid colon mural wall thickening with regional small lymph nodes. 2.  No suspicious distant metastatic lesion 3.  There is slight heterogeneous enhancement of the pancreatic head/uncanny process with some fatty changes and fat stranding likely postinflammatory changes, focal pancreatitis/groove pancreatitis   01/05/2022 Pathology Results   Operation: Laparoscopic anterior resection -Tumor size  5.5 x 5 x 1 cm -Histologic grade: G2, moderately differentiated -Tumor extent: Invades through the muscularis propria into pericolonic tissue -All margins are free of  invasive carcinoma -Number of lymph nodes involved: 1 -Number of lymph nodes examined: 23  Pathologic staging: PT3 N1a      02/25/2022 Initial Diagnosis   Colorectal cancer (HCC)   02/25/2022 Cancer Staging   Staging form: Colon and Rectum, AJCC 8th Edition - Clinical stage from 02/25/2022: Stage IIIB (cT3, cN1a, cM0) - Signed by Heilingoetter, Cassandra L, PA-C on 02/25/2022 Total positive nodes: 1 Histologic grade (G): G2 Histologic grading system: 4 grade system   03/04/2022 - 03/04/2022 Chemotherapy   Patient is on Treatment Plan : COLORECTAL Xelox (Capeox)(130/850) q21d      Genetic Testing   Negative genetic testing. No pathogenic variants identified on the [facility] Multi-Cancer+RNA panel. The report date is 06/12/2022.  The Multi-Cancer + RNA Panel offered by [facility] includes sequencing and/or deletion/duplication analysis of the following 70 genes:  AIP*, ALK, APC*, ATM*, AXIN2*, BAP1*, BARD1*, BLM*, BMPR1A*, BRCA1*, BRCA2*, BRIP1*, CDC73*, CDH1*, CDK4, CDKN1B*, CDKN2A, CHEK2*, CTNNA1*, DICER1*, EPCAM, EGFR, FH*, FLCN*, GREM1, HOXB13, KIT, LZTR1, MAX*, MBD4, MEN1*, MET, MITF, MLH1*, MSH2*, MSH3*, MSH6*, MUTYH*, NF1*, NF2*, NTHL1*, PALB2*, PDGFRA, PMS2*, POLD1*, POLE*, POT1*, PRKAR1A*, PTCH1*, PTEN*, RAD51C*, RAD51D*, RB1*, RET, SDHA*, SDHAF2*, SDHB*, SDHC*, SDHD*, SMAD4*, SMARCA4*, SMARCB1*, SMARCE1*, STK11*, SUFU*, TMEM127*, TP53*, TSC1*, TSC2*, VHL*. RNA analysis is performed for * genes.      Discussed the use of AI scribe software for clinical note transcription with the patient, who gave verbal consent to proceed.  History of Present Illness [name] is a 48 year old female with colon cancer who presents for follow-up.  She underwent surgical resection for colon cancer in December 2023. A follow-up colonoscopy in August 2024 revealed two polyps that were removed. Regular blood work since December shows a slightly elevated platelet count, though improved from previous  levels. She completed one cycle of intravenous chemotherapy followed by oral medication.  She has developed acne-like breakouts on her abdomen, which are itchy. She has not consulted her primary care physician about this issue.     All other systems were reviewed with the patient and are negative.  MEDICAL HISTORY:  Past Medical History:  Diagnosis Date   Colorectal cancer (HCC)    Diabetes mellitus without complication (HCC)    Hyperlipemia     SURGICAL HISTORY: Past Surgical History:  Procedure Laterality Date   COLON SURGERY      I have reviewed the social history and family history with the patient and they are unchanged from previous note.  ALLERGIES:  has no known allergies.  MEDICATIONS:  Current Outpatient Medications  Medication Sig Dispense Refill   amoxicillin -clavulanate (AUGMENTIN ) 875-125 MG tablet Take 1 tablet by mouth every 12 (twelve) hours. 14 tablet 0   atorvastatin (LIPITOR) 10 MG tablet Take 10 mg by mouth at bedtime.     capecitabine  (XELODA ) 500 MG tablet Take 3 tablets (1,500 mg total) by mouth 2 (two) times daily after a meal. Take within 30 minutes after meals. Take for 14 days, then off for 7 days. Repeat every 21 days. 84 tablet 2   dapagliflozin propanediol (FARXIGA) 10 MG TABS tablet Take 10 mg by mouth.     empagliflozin (JARDIANCE) 25 MG TABS tablet Take 25 mg by mouth.     losartan (COZAAR) 50 MG tablet Take 1 tablet by mouth 2 (two) times daily.     metFORMIN (GLUCOPHAGE)  1000 MG tablet Take 0.5 tablets by mouth daily.     omeprazole (PRILOSEC) 20 MG capsule Take 20 mg by mouth.     No current facility-administered medications for this visit.    PHYSICAL EXAMINATION: ECOG PERFORMANCE STATUS: 0 - Asymptomatic  Vitals:   07/05/23 1545  BP: 104/80  Pulse: 94  Resp: 14  Temp: (!) 97.2 F (36.2 C)  SpO2: 99%   Wt Readings from Last 3 Encounters:  07/05/23 219 lb 9.6 oz (99.6 kg)  01/17/23 217 lb 4.8 oz (98.6 kg)  09/12/22 216 lb  4.8 oz (98.1 kg)     GENERAL:alert, no distress and comfortable SKIN: skin color, texture, turgor are normal, no rashes or significant lesions EYES: normal, Conjunctiva are pink and non-injected, sclera clear NECK: supple, thyroid normal size, non-tender, without nodularity LYMPH:  no palpable lymphadenopathy in the cervical, axillary  LUNGS: clear to auscultation and percussion with normal breathing effort HEART: regular rate & rhythm and no murmurs and no lower extremity edema ABDOMEN:abdomen soft, non-tender and normal bowel sounds Musculoskeletal:no cyanosis of digits and no clubbing  NEURO: alert & oriented x 3 with fluent speech, no focal motor/sensory deficits  Physical Exam    LABORATORY DATA:  I have reviewed the data as listed    Latest Ref Rng & Units 07/05/2023    3:30 PM 06/27/2023   11:41 AM 01/13/2023    9:24 AM  CBC  WBC 4.0 - 10.5 K/uL 6.5  6.7  6.8   Hemoglobin 12.0 - 15.0 g/dL 19.1  47.8  29.5   Hematocrit 36.0 - 46.0 % 42.5  45.1  41.9   Platelets 150 - 400 K/uL 487  523  485         Latest Ref Rng & Units 07/05/2023    3:30 PM 06/27/2023   11:31 AM 01/13/2023    9:24 AM  CMP  Glucose 70 - 99 mg/dL 621  308  657   BUN 6 - 20 mg/dL 23  30  26    Creatinine 0.44 - 1.00 mg/dL 8.46  9.62  9.52   Sodium 135 - 145 mmol/L 137  139  140   Potassium 3.5 - 5.1 mmol/L 4.1  3.9  3.8   Chloride 98 - 111 mmol/L 103  108  106   CO2 22 - 32 mmol/L 26  19  26    Calcium 8.9 - 10.3 mg/dL 84.1  9.4  9.9   Total Protein 6.5 - 8.1 g/dL 8.2  7.9  7.8   Total Bilirubin 0.0 - 1.2 mg/dL 0.2  0.3  0.3   Alkaline Phos 38 - 126 U/L 81  90  71   AST 15 - 41 U/L 16  17  15    ALT 0 - 44 U/L 16  15  14        RADIOGRAPHIC STUDIES: I have personally reviewed the radiological images as listed and agreed with the findings in the report. No results found.    No orders of the defined types were placed in this encounter.  All questions were answered. The patient knows to call the  clinic with any problems, questions or concerns. No barriers to learning was detected. The total time spent in the appointment was 30 minutes, including review of chart and various tests results, discussions about plan of care and coordination of care plan     Sonja Hayden Lake, MD 07/05/2023

## 2023-07-05 NOTE — Assessment & Plan Note (Signed)
 Sigmoid colon cancer, stage IIIb, pT3 N1a M0, MMR normal -Diagnosed in November 2023, status post surgical resection on January 05, 2022 --I recommended adjuvant chemotherapy CapeOx, she started on 03/04/22 , due to poor tolerance, she declined more IV chemo after her first cycle.  We switched to Xeloda from cycle 2, she is tolerating well overall. -She was seen in the ED on Jun 08, 2022 for flank pain, CT of renal protocol was obtained which showed no stones, and no evidence of cancer recurrence.  I reviewed with patient. -She completed cycle 8 Xeloda in early August 2024 -surveillance CT 01/13/23 showed NED. Will repeat CT in 6 months, ctDNA Signatera in 3 months.

## 2023-07-06 ENCOUNTER — Other Ambulatory Visit: Payer: Self-pay

## 2023-07-06 DIAGNOSIS — C19 Malignant neoplasm of rectosigmoid junction: Secondary | ICD-10-CM

## 2023-07-06 LAB — CEA (ACCESS): CEA (CHCC): 1.79 ng/mL (ref 0.00–5.00)

## 2023-07-06 NOTE — Progress Notes (Signed)
 As per Dr. Maryalice Smaller, placed order for Signatera in portal, uploaded a copy of all required paperwork, received confirmation of receipt. Placed kit in lab to be drawn on 06/06.

## 2023-07-07 ENCOUNTER — Inpatient Hospital Stay

## 2023-07-07 DIAGNOSIS — C187 Malignant neoplasm of sigmoid colon: Secondary | ICD-10-CM

## 2023-07-07 DIAGNOSIS — C19 Malignant neoplasm of rectosigmoid junction: Secondary | ICD-10-CM

## 2023-07-07 DIAGNOSIS — D500 Iron deficiency anemia secondary to blood loss (chronic): Secondary | ICD-10-CM

## 2023-07-07 LAB — GENETIC SCREENING ORDER

## 2023-07-10 ENCOUNTER — Other Ambulatory Visit: Payer: Self-pay

## 2023-07-11 ENCOUNTER — Other Ambulatory Visit: Payer: Self-pay

## 2023-07-12 ENCOUNTER — Telehealth: Payer: Self-pay | Admitting: Hematology

## 2023-07-12 ENCOUNTER — Other Ambulatory Visit: Payer: Self-pay

## 2023-07-12 DIAGNOSIS — C187 Malignant neoplasm of sigmoid colon: Secondary | ICD-10-CM

## 2023-07-12 DIAGNOSIS — C19 Malignant neoplasm of rectosigmoid junction: Secondary | ICD-10-CM

## 2023-07-12 NOTE — Progress Notes (Signed)
 Received an email from Darlina Einstein w/[facility] requesting and alternative pathology for this pt since the 01/05/2022 surgical sample is high risk for QNS.  Sent Alexandria Ida the pt's alternative pathology report which was done in [place], [place] on 12/12/2021 (Case# [id number]) at the [facility].  Spoke with [facility] Pathology Department to see if they have the surgical sample done in [place], [place] [phone]) and was told the surgical sample is still international.  Consulted with Dr. Randye Buttner since Dr. Maryalice Smaller is currently out of the office until 07/24/2023 to see what is the best option/s for this patient.  Dr. Randye Buttner stated to cancel the Signatera test since the tissue and molecular samples are needed to perform the test.  Dr. Randye Buttner stated to do both Guardant Reveal and Guardant 360 in place of the Signatera since only molecular sample is needed to do the test.  Placed order for both.  Discontinued the order for Signatera in Epic and notified Alexandria Ida w/[facility] to cancel the order in their system.  Spoke with [employer] Rep w/[facility] regarding Guardant test.  Alston Jerry stated that both Guardant test can be drawn at the same time.  Alston Jerry stated that the Guardant Reveal test will be processed first and then the Guardant 360 test will be processed 21 days later. Gave test requisitions to Winda Hastings in [facility] lab and placed orders in [facility] for both Guardant test.

## 2023-07-17 ENCOUNTER — Encounter: Payer: Self-pay | Admitting: Hematology

## 2023-08-30 ENCOUNTER — Other Ambulatory Visit: Payer: Self-pay

## 2023-10-04 ENCOUNTER — Inpatient Hospital Stay: Admitting: Hematology

## 2023-10-04 ENCOUNTER — Inpatient Hospital Stay: Attending: Hematology

## 2023-10-04 NOTE — Assessment & Plan Note (Deleted)
 Sigmoid colon cancer, stage IIIb, pT3 N1a M0, MMR normal -Diagnosed in November 2023, status post surgical resection on January 05, 2022 --I recommended adjuvant chemotherapy CapeOx, she started on 03/04/22 , due to poor tolerance, she declined more IV chemo after her first cycle.  We switched to Xeloda from cycle 2, she is tolerating well overall. -She was seen in the ED on Jun 08, 2022 for flank pain, CT of renal protocol was obtained which showed no stones, and no evidence of cancer recurrence.  I reviewed with patient. -She completed cycle 8 Xeloda in early August 2024 -surveillance CT 01/13/23 showed NED. Will repeat CT in 6 months, ctDNA Signatera in 3 months.
